# Patient Record
Sex: Female | Born: 1940 | Race: White | Hispanic: No | Smoking: Current every day smoker
Health system: Southern US, Community
[De-identification: ages and names within clinical notes are randomized; demographics above are authoritative.]

## PROBLEM LIST (undated history)

## (undated) DIAGNOSIS — I10 Essential (primary) hypertension: Secondary | ICD-10-CM

## (undated) DIAGNOSIS — E785 Hyperlipidemia, unspecified: Secondary | ICD-10-CM

## (undated) DIAGNOSIS — I872 Venous insufficiency (chronic) (peripheral): Secondary | ICD-10-CM

## (undated) DIAGNOSIS — G4733 Obstructive sleep apnea (adult) (pediatric): Secondary | ICD-10-CM

## (undated) DIAGNOSIS — J449 Chronic obstructive pulmonary disease, unspecified: Secondary | ICD-10-CM

## (undated) HISTORY — PX: JOINT REPLACEMENT: SHX530

## (undated) HISTORY — PX: CARPAL TUNNEL RELEASE: SHX101

---

## 1998-03-10 ENCOUNTER — Emergency Department (HOSPITAL_COMMUNITY): Admission: EM | Admit: 1998-03-10 | Discharge: 1998-03-10 | Payer: Self-pay | Admitting: Emergency Medicine

## 1998-03-10 ENCOUNTER — Encounter: Payer: Self-pay | Admitting: Emergency Medicine

## 1998-04-13 ENCOUNTER — Encounter: Admission: RE | Admit: 1998-04-13 | Discharge: 1998-06-04 | Payer: Self-pay

## 1998-09-25 ENCOUNTER — Encounter: Payer: Self-pay | Admitting: Family Medicine

## 1998-09-25 ENCOUNTER — Ambulatory Visit (HOSPITAL_COMMUNITY): Admission: RE | Admit: 1998-09-25 | Discharge: 1998-09-25 | Payer: Self-pay | Admitting: Family Medicine

## 1998-10-15 ENCOUNTER — Ambulatory Visit (HOSPITAL_COMMUNITY): Admission: RE | Admit: 1998-10-15 | Discharge: 1998-10-15 | Payer: Self-pay | Admitting: Family Medicine

## 1999-12-27 ENCOUNTER — Emergency Department (HOSPITAL_COMMUNITY): Admission: EM | Admit: 1999-12-27 | Discharge: 1999-12-28 | Payer: Self-pay | Admitting: Emergency Medicine

## 1999-12-27 ENCOUNTER — Encounter: Payer: Self-pay | Admitting: Emergency Medicine

## 1999-12-28 ENCOUNTER — Encounter: Payer: Self-pay | Admitting: Emergency Medicine

## 2000-04-04 ENCOUNTER — Encounter: Payer: Self-pay | Admitting: Family Medicine

## 2000-04-04 ENCOUNTER — Encounter: Admission: RE | Admit: 2000-04-04 | Discharge: 2000-04-04 | Payer: Self-pay | Admitting: Family Medicine

## 2000-05-15 ENCOUNTER — Encounter: Admission: RE | Admit: 2000-05-15 | Discharge: 2000-05-15 | Payer: Self-pay | Admitting: Neurosurgery

## 2000-05-15 ENCOUNTER — Encounter: Payer: Self-pay | Admitting: Neurosurgery

## 2000-05-29 ENCOUNTER — Encounter: Payer: Self-pay | Admitting: Neurosurgery

## 2000-05-29 ENCOUNTER — Encounter: Admission: RE | Admit: 2000-05-29 | Discharge: 2000-05-29 | Payer: Self-pay | Admitting: Neurosurgery

## 2000-06-12 ENCOUNTER — Encounter: Admission: RE | Admit: 2000-06-12 | Discharge: 2000-08-08 | Payer: Self-pay | Admitting: Neurosurgery

## 2000-08-09 ENCOUNTER — Encounter: Admission: RE | Admit: 2000-08-09 | Discharge: 2000-09-21 | Payer: Self-pay | Admitting: Neurosurgery

## 2000-08-29 ENCOUNTER — Ambulatory Visit (HOSPITAL_COMMUNITY): Admission: RE | Admit: 2000-08-29 | Discharge: 2000-08-29 | Payer: Self-pay | Admitting: Family Medicine

## 2000-08-29 ENCOUNTER — Encounter: Payer: Self-pay | Admitting: Family Medicine

## 2000-09-13 ENCOUNTER — Ambulatory Visit: Admission: RE | Admit: 2000-09-13 | Discharge: 2000-09-13 | Payer: Self-pay | Admitting: Family Medicine

## 2003-12-09 ENCOUNTER — Emergency Department (HOSPITAL_COMMUNITY): Admission: EM | Admit: 2003-12-09 | Discharge: 2003-12-09 | Payer: Self-pay | Admitting: Emergency Medicine

## 2004-06-09 ENCOUNTER — Ambulatory Visit (HOSPITAL_COMMUNITY): Admission: RE | Admit: 2004-06-09 | Discharge: 2004-06-10 | Payer: Self-pay | Admitting: Orthopedic Surgery

## 2004-12-20 ENCOUNTER — Ambulatory Visit (HOSPITAL_BASED_OUTPATIENT_CLINIC_OR_DEPARTMENT_OTHER): Admission: RE | Admit: 2004-12-20 | Discharge: 2004-12-20 | Payer: Self-pay | Admitting: Family Medicine

## 2004-12-26 ENCOUNTER — Ambulatory Visit: Payer: Self-pay | Admitting: Internal Medicine

## 2006-03-13 ENCOUNTER — Inpatient Hospital Stay (HOSPITAL_COMMUNITY): Admission: EM | Admit: 2006-03-13 | Discharge: 2006-03-15 | Payer: Self-pay | Admitting: Emergency Medicine

## 2006-03-14 ENCOUNTER — Encounter (INDEPENDENT_AMBULATORY_CARE_PROVIDER_SITE_OTHER): Payer: Self-pay | Admitting: Cardiology

## 2006-03-17 ENCOUNTER — Ambulatory Visit: Admission: RE | Admit: 2006-03-17 | Discharge: 2006-03-17 | Payer: Self-pay | Admitting: Family Medicine

## 2006-04-10 ENCOUNTER — Ambulatory Visit: Payer: Self-pay | Admitting: Internal Medicine

## 2006-06-09 ENCOUNTER — Ambulatory Visit: Payer: Self-pay | Admitting: Internal Medicine

## 2006-06-14 ENCOUNTER — Ambulatory Visit: Payer: Self-pay | Admitting: Internal Medicine

## 2007-05-03 ENCOUNTER — Encounter: Payer: Self-pay | Admitting: Internal Medicine

## 2008-01-24 ENCOUNTER — Emergency Department (HOSPITAL_COMMUNITY): Admission: EM | Admit: 2008-01-24 | Discharge: 2008-01-25 | Payer: Self-pay | Admitting: Emergency Medicine

## 2008-01-28 ENCOUNTER — Emergency Department (HOSPITAL_COMMUNITY): Admission: EM | Admit: 2008-01-28 | Discharge: 2008-01-28 | Payer: Self-pay | Admitting: *Deleted

## 2008-01-30 ENCOUNTER — Encounter: Admission: RE | Admit: 2008-01-30 | Discharge: 2008-01-30 | Payer: Self-pay | Admitting: Family Medicine

## 2009-01-07 ENCOUNTER — Ambulatory Visit: Payer: Self-pay | Admitting: Vascular Surgery

## 2009-01-07 ENCOUNTER — Encounter (INDEPENDENT_AMBULATORY_CARE_PROVIDER_SITE_OTHER): Payer: Self-pay | Admitting: Cardiovascular Disease

## 2009-01-07 ENCOUNTER — Inpatient Hospital Stay (HOSPITAL_COMMUNITY): Admission: EM | Admit: 2009-01-07 | Discharge: 2009-01-14 | Payer: Self-pay | Admitting: Emergency Medicine

## 2009-01-07 ENCOUNTER — Ambulatory Visit: Payer: Self-pay | Admitting: Cardiovascular Disease

## 2010-01-21 ENCOUNTER — Encounter (HOSPITAL_BASED_OUTPATIENT_CLINIC_OR_DEPARTMENT_OTHER)
Admission: RE | Admit: 2010-01-21 | Discharge: 2010-02-09 | Payer: Self-pay | Source: Home / Self Care | Attending: Internal Medicine | Admitting: Internal Medicine

## 2010-01-25 LAB — GLUCOSE, CAPILLARY: Glucose-Capillary: 252 mg/dL — ABNORMAL HIGH (ref 70–99)

## 2010-02-11 ENCOUNTER — Encounter (HOSPITAL_BASED_OUTPATIENT_CLINIC_OR_DEPARTMENT_OTHER): Payer: Medicare Other | Attending: Internal Medicine

## 2010-02-11 DIAGNOSIS — I10 Essential (primary) hypertension: Secondary | ICD-10-CM | POA: Insufficient documentation

## 2010-02-11 DIAGNOSIS — J449 Chronic obstructive pulmonary disease, unspecified: Secondary | ICD-10-CM | POA: Insufficient documentation

## 2010-02-11 DIAGNOSIS — J4489 Other specified chronic obstructive pulmonary disease: Secondary | ICD-10-CM | POA: Insufficient documentation

## 2010-02-11 DIAGNOSIS — R609 Edema, unspecified: Secondary | ICD-10-CM | POA: Insufficient documentation

## 2010-02-11 DIAGNOSIS — L97809 Non-pressure chronic ulcer of other part of unspecified lower leg with unspecified severity: Secondary | ICD-10-CM | POA: Insufficient documentation

## 2010-02-11 DIAGNOSIS — I872 Venous insufficiency (chronic) (peripheral): Secondary | ICD-10-CM | POA: Insufficient documentation

## 2010-02-11 DIAGNOSIS — E119 Type 2 diabetes mellitus without complications: Secondary | ICD-10-CM | POA: Insufficient documentation

## 2010-02-11 DIAGNOSIS — Z794 Long term (current) use of insulin: Secondary | ICD-10-CM | POA: Insufficient documentation

## 2010-02-12 ENCOUNTER — Encounter (INDEPENDENT_AMBULATORY_CARE_PROVIDER_SITE_OTHER): Payer: Medicare Other

## 2010-02-12 DIAGNOSIS — L97209 Non-pressure chronic ulcer of unspecified calf with unspecified severity: Secondary | ICD-10-CM

## 2010-03-11 ENCOUNTER — Encounter (HOSPITAL_BASED_OUTPATIENT_CLINIC_OR_DEPARTMENT_OTHER): Payer: Medicare Other | Attending: Internal Medicine

## 2010-03-11 DIAGNOSIS — R609 Edema, unspecified: Secondary | ICD-10-CM | POA: Insufficient documentation

## 2010-03-11 DIAGNOSIS — I10 Essential (primary) hypertension: Secondary | ICD-10-CM | POA: Insufficient documentation

## 2010-03-11 DIAGNOSIS — J449 Chronic obstructive pulmonary disease, unspecified: Secondary | ICD-10-CM | POA: Insufficient documentation

## 2010-03-11 DIAGNOSIS — L97809 Non-pressure chronic ulcer of other part of unspecified lower leg with unspecified severity: Secondary | ICD-10-CM | POA: Insufficient documentation

## 2010-03-11 DIAGNOSIS — E119 Type 2 diabetes mellitus without complications: Secondary | ICD-10-CM | POA: Insufficient documentation

## 2010-03-11 DIAGNOSIS — J4489 Other specified chronic obstructive pulmonary disease: Secondary | ICD-10-CM | POA: Insufficient documentation

## 2010-03-11 DIAGNOSIS — I872 Venous insufficiency (chronic) (peripheral): Secondary | ICD-10-CM | POA: Insufficient documentation

## 2010-03-11 DIAGNOSIS — Z794 Long term (current) use of insulin: Secondary | ICD-10-CM | POA: Insufficient documentation

## 2010-03-28 LAB — BASIC METABOLIC PANEL
BUN: 10 mg/dL (ref 6–23)
BUN: 9 mg/dL (ref 6–23)
CO2: 25 mEq/L (ref 19–32)
CO2: 28 mEq/L (ref 19–32)
CO2: 30 mEq/L (ref 19–32)
Calcium: 8.3 mg/dL — ABNORMAL LOW (ref 8.4–10.5)
Calcium: 8.5 mg/dL (ref 8.4–10.5)
Chloride: 102 mEq/L (ref 96–112)
Chloride: 102 mEq/L (ref 96–112)
Chloride: 104 mEq/L (ref 96–112)
Creatinine, Ser: 0.37 mg/dL — ABNORMAL LOW (ref 0.4–1.2)
Creatinine, Ser: 0.4 mg/dL (ref 0.4–1.2)
Creatinine, Ser: 0.49 mg/dL (ref 0.4–1.2)
GFR calc Af Amer: >60 mL/min (ref >60)
GFR calc non Af Amer: >60 mL/min (ref >60)
Glucose, Bld: 152 mg/dL — ABNORMAL HIGH (ref 70–99)
Glucose, Bld: 180 mg/dL — ABNORMAL HIGH (ref 70–99)
Glucose, Bld: 198 mg/dL — ABNORMAL HIGH (ref 70–99)
Potassium: 3.3 mEq/L — ABNORMAL LOW (ref 3.5–5.1)
Potassium: 3.6 mEq/L (ref 3.5–5.1)
Sodium: 135 mEq/L (ref 135–145)

## 2010-03-28 LAB — GLUCOSE, CAPILLARY
Glucose-Capillary: 110 mg/dL — ABNORMAL HIGH (ref 70–99)
Glucose-Capillary: 146 mg/dL — ABNORMAL HIGH (ref 70–99)
Glucose-Capillary: 177 mg/dL — ABNORMAL HIGH (ref 70–99)
Glucose-Capillary: 178 mg/dL — ABNORMAL HIGH (ref 70–99)
Glucose-Capillary: 191 mg/dL — ABNORMAL HIGH (ref 70–99)
Glucose-Capillary: 216 mg/dL — ABNORMAL HIGH (ref 70–99)
Glucose-Capillary: 219 mg/dL — ABNORMAL HIGH (ref 70–99)
Glucose-Capillary: 221 mg/dL — ABNORMAL HIGH (ref 70–99)
Glucose-Capillary: 221 mg/dL — ABNORMAL HIGH (ref 70–99)
Glucose-Capillary: 267 mg/dL — ABNORMAL HIGH (ref 70–99)
Glucose-Capillary: 313 mg/dL — ABNORMAL HIGH (ref 70–99)
Glucose-Capillary: 368 mg/dL — ABNORMAL HIGH (ref 70–99)

## 2010-03-28 LAB — PHOSPHORUS: Phosphorus: 4.1 mg/dL (ref 2.3–4.6)

## 2010-04-12 LAB — BLOOD GAS, ARTERIAL
Bicarbonate: 24.1 mEq/L — ABNORMAL HIGH (ref 20.0–24.0)
Drawn by: 232811
O2 Content: 6 L/min
O2 Saturation: 90.3 %
Patient temperature: 98.6
pH, Arterial: 7.404 — ABNORMAL HIGH (ref 7.350–7.400)

## 2010-04-12 LAB — GLUCOSE, CAPILLARY
Glucose-Capillary: 192 mg/dL — ABNORMAL HIGH (ref 70–99)
Glucose-Capillary: 230 mg/dL — ABNORMAL HIGH (ref 70–99)
Glucose-Capillary: 272 mg/dL — ABNORMAL HIGH (ref 70–99)
Glucose-Capillary: 282 mg/dL — ABNORMAL HIGH (ref 70–99)
Glucose-Capillary: 287 mg/dL — ABNORMAL HIGH (ref 70–99)
Glucose-Capillary: 329 mg/dL — ABNORMAL HIGH (ref 70–99)
Glucose-Capillary: 339 mg/dL — ABNORMAL HIGH (ref 70–99)
Glucose-Capillary: 349 mg/dL — ABNORMAL HIGH (ref 70–99)
Glucose-Capillary: 357 mg/dL — ABNORMAL HIGH (ref 70–99)

## 2010-04-12 LAB — CK TOTAL AND CKMB (NOT AT ARMC)
CK, MB: 2.5 ng/mL (ref 0.3–4.0)
CK, MB: 2.6 ng/mL (ref 0.3–4.0)
CK, MB: 2.7 ng/mL (ref 0.3–4.0)
Relative Index: 1.1 (ref 0.0–2.5)
Relative Index: 1.2 (ref 0.0–2.5)
Total CK: 225 U/L — ABNORMAL HIGH (ref 7–177)
Total CK: 227 U/L — ABNORMAL HIGH (ref 7–177)
Total CK: 229 U/L — ABNORMAL HIGH (ref 7–177)

## 2010-04-12 LAB — BASIC METABOLIC PANEL
BUN: 17 mg/dL (ref 6–23)
BUN: 7 mg/dL (ref 6–23)
BUN: 8 mg/dL (ref 6–23)
CO2: 25 mEq/L (ref 19–32)
Calcium: 8 mg/dL — ABNORMAL LOW (ref 8.4–10.5)
Calcium: 8.5 mg/dL (ref 8.4–10.5)
Chloride: 101 mEq/L (ref 96–112)
Chloride: 103 mEq/L (ref 96–112)
Chloride: 105 mEq/L (ref 96–112)
Creatinine, Ser: 0.46 mg/dL (ref 0.4–1.2)
Creatinine, Ser: 0.51 mg/dL (ref 0.4–1.2)
Creatinine, Ser: 0.52 mg/dL (ref 0.4–1.2)
GFR calc Af Amer: >60 mL/min (ref >60)
GFR calc non Af Amer: >60 mL/min (ref >60)
GFR calc non Af Amer: >60 mL/min (ref >60)
Glucose, Bld: 310 mg/dL — ABNORMAL HIGH (ref 70–99)
Glucose, Bld: 317 mg/dL — ABNORMAL HIGH (ref 70–99)
Potassium: 3.4 mEq/L — ABNORMAL LOW (ref 3.5–5.1)
Potassium: 3.7 mEq/L (ref 3.5–5.1)
Sodium: 136 mEq/L (ref 135–145)

## 2010-04-12 LAB — DIFFERENTIAL
Basophils Absolute: 0 10*3/uL (ref 0.0–0.1)
Basophils Absolute: 0 10*3/uL (ref 0.0–0.1)
Eosinophils Absolute: 0 10*3/uL (ref 0.0–0.7)
Eosinophils Relative: 0 % (ref 0–5)
Lymphocytes Relative: 13 % (ref 12–46)
Lymphocytes Relative: 7 % — ABNORMAL LOW (ref 12–46)
Lymphs Abs: 0.6 10*3/uL — ABNORMAL LOW (ref 0.7–4.0)
Neutro Abs: 7.8 10*3/uL — ABNORMAL HIGH (ref 1.7–7.7)
Neutrophils Relative %: 82 % — ABNORMAL HIGH (ref 43–77)
Neutrophils Relative %: 91 % — ABNORMAL HIGH (ref 43–77)

## 2010-04-12 LAB — URINALYSIS, ROUTINE W REFLEX MICROSCOPIC
Nitrite: POSITIVE — AB
Protein, ur: NEGATIVE mg/dL
Specific Gravity, Urine: 1.035 — ABNORMAL HIGH (ref 1.005–1.030)
Urobilinogen, UA: 1 mg/dL (ref 0.0–1.0)

## 2010-04-12 LAB — CULTURE, BLOOD (ROUTINE X 2)
Culture: NO GROWTH
Culture: NO GROWTH

## 2010-04-12 LAB — CBC
HCT: 37.9 % (ref 36.0–46.0)
MCHC: 32.3 g/dL (ref 30.0–36.0)
MCV: 85.8 fL (ref 78.0–100.0)
MCV: 86 fL (ref 78.0–100.0)
Platelets: 185 10*3/uL (ref 150–400)
Platelets: 187 10*3/uL (ref 150–400)
RDW: 16.5 % — ABNORMAL HIGH (ref 11.5–15.5)
RDW: 17.3 % — ABNORMAL HIGH (ref 11.5–15.5)
WBC: 8.6 10*3/uL (ref 4.0–10.5)

## 2010-04-12 LAB — LIPID PANEL
Cholesterol: 125 mg/dL (ref 0–200)
LDL Cholesterol: 55 mg/dL (ref 0–99)
Triglycerides: 206 mg/dL — ABNORMAL HIGH (ref <150)

## 2010-04-12 LAB — URINE CULTURE

## 2010-04-12 LAB — URINE MICROSCOPIC-ADD ON

## 2010-04-12 LAB — TROPONIN I
Troponin I: 0.02 ng/mL (ref 0.00–0.06)
Troponin I: 0.03 ng/mL (ref 0.00–0.06)

## 2010-04-12 LAB — HEMOGLOBIN A1C: Hgb A1c MFr Bld: 11.2 % — ABNORMAL HIGH (ref 4.6–6.1)

## 2010-04-15 ENCOUNTER — Encounter (HOSPITAL_BASED_OUTPATIENT_CLINIC_OR_DEPARTMENT_OTHER): Payer: Medicare Other | Attending: Internal Medicine

## 2010-04-15 DIAGNOSIS — E119 Type 2 diabetes mellitus without complications: Secondary | ICD-10-CM | POA: Insufficient documentation

## 2010-04-15 DIAGNOSIS — J4489 Other specified chronic obstructive pulmonary disease: Secondary | ICD-10-CM | POA: Insufficient documentation

## 2010-04-15 DIAGNOSIS — R609 Edema, unspecified: Secondary | ICD-10-CM | POA: Insufficient documentation

## 2010-04-15 DIAGNOSIS — Z794 Long term (current) use of insulin: Secondary | ICD-10-CM | POA: Insufficient documentation

## 2010-04-15 DIAGNOSIS — I872 Venous insufficiency (chronic) (peripheral): Secondary | ICD-10-CM | POA: Insufficient documentation

## 2010-04-15 DIAGNOSIS — L97809 Non-pressure chronic ulcer of other part of unspecified lower leg with unspecified severity: Secondary | ICD-10-CM | POA: Insufficient documentation

## 2010-04-15 DIAGNOSIS — I10 Essential (primary) hypertension: Secondary | ICD-10-CM | POA: Insufficient documentation

## 2010-04-15 DIAGNOSIS — J449 Chronic obstructive pulmonary disease, unspecified: Secondary | ICD-10-CM | POA: Insufficient documentation

## 2010-04-26 LAB — BRAIN NATRIURETIC PEPTIDE: Pro B Natriuretic peptide (BNP): 38 pg/mL (ref 0.0–100.0)

## 2010-04-26 LAB — URINALYSIS, ROUTINE W REFLEX MICROSCOPIC
Glucose, UA: NEGATIVE mg/dL
Ketones, ur: 40 mg/dL — AB
Protein, ur: NEGATIVE mg/dL

## 2010-04-26 LAB — BASIC METABOLIC PANEL
BUN: 7 mg/dL (ref 6–23)
BUN: 4 mg/dL — ABNORMAL LOW (ref 6–23)
CO2: 26 mEq/L (ref 19–32)
CO2: 23 mEq/L (ref 19–32)
Calcium: 9.3 mg/dL (ref 8.4–10.5)
Calcium: 9.2 mg/dL (ref 8.4–10.5)
Chloride: 101 mEq/L (ref 96–112)
Chloride: 101 mEq/L (ref 96–112)
Creatinine, Ser: 0.43 mg/dL (ref 0.4–1.2)
Creatinine, Ser: 0.37 mg/dL — ABNORMAL LOW (ref 0.4–1.2)
GFR calc Af Amer: >60 mL/min (ref >60)
GFR calc Af Amer: >60 mL/min (ref >60)
GFR calc non Af Amer: >60 mL/min (ref >60)
GFR calc non Af Amer: >60 mL/min (ref >60)
Glucose, Bld: 182 mg/dL — ABNORMAL HIGH (ref 70–99)
Glucose, Bld: 159 mg/dL — ABNORMAL HIGH (ref 70–99)
Potassium: 3.2 mEq/L — ABNORMAL LOW (ref 3.5–5.1)
Potassium: 3.1 mEq/L — ABNORMAL LOW (ref 3.5–5.1)
Sodium: 139 mEq/L (ref 135–145)
Sodium: 137 mEq/L (ref 135–145)

## 2010-04-26 LAB — URINE MICROSCOPIC-ADD ON

## 2010-04-26 LAB — CBC
HCT: 44.7 % (ref 36.0–46.0)
HCT: 45.9 % (ref 36.0–46.0)
Hemoglobin: 14.4 g/dL (ref 12.0–15.0)
MCHC: 32.7 g/dL (ref 30.0–36.0)
MCHC: 31.4 g/dL (ref 30.0–36.0)
MCV: 80 fL (ref 78.0–100.0)
MCV: 82 fL (ref 78.0–100.0)
Platelets: 220 10*3/uL (ref 150–400)
Platelets: 211 10*3/uL (ref 150–400)
RBC: 5.6 MIL/uL — ABNORMAL HIGH (ref 3.87–5.11)
RDW: 19.4 % — ABNORMAL HIGH (ref 11.5–15.5)
RDW: 18.9 % — ABNORMAL HIGH (ref 11.5–15.5)
WBC: 10.8 10*3/uL — ABNORMAL HIGH (ref 4.0–10.5)

## 2010-04-26 LAB — POCT CARDIAC MARKERS
CKMB, poc: 1.9 ng/mL (ref 1.0–8.0)
CKMB, poc: 2.3 ng/mL (ref 1.0–8.0)
Myoglobin, poc: 36.1 ng/mL (ref 12–200)
Myoglobin, poc: 57.4 ng/mL (ref 12–200)
Troponin i, poc: <0.05 ng/mL (ref 0.00–0.09)
Troponin i, poc: <0.05 ng/mL (ref 0.00–0.09)

## 2010-04-26 LAB — DIFFERENTIAL
Basophils Absolute: 0.1 10*3/uL (ref 0.0–0.1)
Basophils Absolute: 0 10*3/uL (ref 0.0–0.1)
Basophils Relative: 1 % (ref 0–1)
Basophils Relative: 0 % (ref 0–1)
Eosinophils Absolute: 0.2 10*3/uL (ref 0.0–0.7)
Eosinophils Absolute: 0.3 10*3/uL (ref 0.0–0.7)
Eosinophils Relative: 2 % (ref 0–5)
Eosinophils Relative: 3 % (ref 0–5)
Lymphocytes Relative: 19 % (ref 12–46)
Lymphs Abs: 2.1 10*3/uL (ref 0.7–4.0)
Monocytes Absolute: 0.6 10*3/uL (ref 0.1–1.0)
Monocytes Relative: 5 % (ref 3–12)
Neutro Abs: 7.8 10*3/uL — ABNORMAL HIGH (ref 1.7–7.7)
Neutrophils Relative %: 72 % (ref 43–77)

## 2010-04-26 LAB — POCT I-STAT 3, ART BLOOD GAS (G3+)
Acid-Base Excess: 2 mmol/L (ref 0.0–2.0)
Bicarbonate: 25.5 mEq/L — ABNORMAL HIGH (ref 20.0–24.0)
O2 Saturation: 91 %
Patient temperature: 97.5
TCO2: 27 mmol/L (ref 0–100)
pCO2 arterial: 35.9 mmHg (ref 35.0–45.0)
pH, Arterial: 7.457 — ABNORMAL HIGH (ref 7.350–7.400)
pO2, Arterial: 55 mmHg — ABNORMAL LOW (ref 80.0–100.0)

## 2010-05-25 NOTE — Assessment & Plan Note (Signed)
[facility]                             PULMONARY OFFICE NOTE   NAME:Denise Lee, Denise Lee                   MRN:          [id number]  DATE:06/14/2006                            DOB:          11/05/1940    HISTORY OF PRESENT ILLNESS:  A 70 year old, white female who is  wheelchair bound, oxygen-dependent and still smoking cigarettes with  diagnosis of COPD, but minimum air flow obstruction.  She does, however,  have significant reduction in diffusion capacity that is probably  related to smoking.  CT scan dated March 13, 2006, does not show any  evidence of pulmonary emboli or significant interstitial lung disease.   She states that she is breathing okay, but basically staying in a  wheelchair most of the time.  She is having no orthopnea, nocturnal  wheeze, fevers, chills, sweats or need for rescue therapy.   Since her previous visit, lisinopril was stopped and she is having  marked improvement in throat clearing, but still has a smoker's  rattle.   MEDICATIONS:  Advair 100/50 b.i.d.  See face sheet dated June 14, 2006.   PHYSICAL EXAMINATION:  GENERAL:  She is a depressed-appearing,  ambulatory, white, obese female in no acute distress.  VITAL SIGNS:  Stable.  HEENT:  Unremarkable.  Oropharynx clear.  LUNGS:  Diminished breath sounds, but no wheezing.  HEART:  Regular rate and rhythm without murmurs, rubs or gallops.  ABDOMEN:  Soft, benign.  EXTREMITIES:  Warm without calf tenderness, clubbing, cyanosis or edema   IMPRESSION/PLAN:  Chronic obstructive pulmonary disease with asthmatic  bronchitic component and acquired mucosal dysfunction secondary to  ongoing smoking.  In this setting, I believe Advair is doing a  reasonable job even at a low dose in reversing airway inflammation and  my only recommendation, if needed, would be to increase the Advair up to  250/50 b.i.d.  Based on how well-preserved her FEV-1 is, she would not  benefit at this  point from Spiriva.   In fact, I do not believe she would really benefit from continuing to  see me based on her lung function studies.  There is nothing we have to  offer here other than the advice to stop smoking.  She asked me, isn't  there something you can give me for this.  I told her that I would  defer this to Dr. Tiburcio Pea, but put the ball back in her court by asking  her to set a quit date and then see Dr. Tiburcio Pea at least a week before  the quit date for strategy to maintain off of cigarettes and would defer  this issue to Dr. Tiburcio Pea' capable hands unless otherwise requested by  Dr. Tiburcio Pea.   If she continues to smoke, I believe she will have a downhill course  with recurrent bronchitis and further loss of diffusion capacity.  There  is no medication that will reverse this and I explained this to her  bluntly.  If she stopped smoking, I believe her lung function is well  enough preserved that she will not need to see me either because she  will have no significant pulmonary symptoms to treat and very well may  be able to come off the oxygen completely, too.  I told her the only way  to know this is to prove me wrong by stopping smoking and then re-  evaluating 6 weeks later.     Denise Lee. Sherene Sires, MD, [facility]  Electronically Signed    MBW/MedQ  DD: 06/15/2006  DT: 06/15/2006  Job #: [id number]   cc:   Holley Bouche, M.D.

## 2010-05-28 NOTE — Discharge Summary (Signed)
Denise Lee, Denise Lee            ACCOUNT NO.:  [id number]   MEDICAL RECORD NO.:  [id number]          PATIENT TYPE:  INP   LOCATION:  1432                         FACILITY:  [facility]   PHYSICIAN:  Andres Shad. Rudean Curt, MD     DATE OF BIRTH:  03/03/1940   DATE OF ADMISSION:  03/13/2006  DATE OF DISCHARGE:  03/15/2006                               DISCHARGE SUMMARY   DISCHARGE DIAGNOSES:  1. Hyponatremia.  2. Hypoxemia.   DISCHARGE MEDICATIONS:  1. Avelox 400 mg once daily for 7 days.  2. Prednisone tapering over 7 days.  3. Albuterol 2 puffs with spacer every 4-6 hours as needed.  4. Glipizide 10 mg twice daily.  5. Avandia 8 mg daily.  6. Metformin 1000 mg twice daily.  7. Paxil 40 mg daily.  8. Tramadol 50 mg every 12 hours as needed.  9. Calcium with vitamin D 1200 mg twice daily.  10.Vytorin 10/40 mg daily.  11.Lisinopril with hydrochlorothiazide 10/12.5 mg daily.  12.Aspirin 325 mg daily.  13.Advair twice daily.   CONDITION ON DISCHARGE:  Improved.   SUMMARY OF HOSPITALIZATION:  Denise Lee is a 70 year old female with a  past medical history significant for type 2 diabetes,  hypercholesterolemia, hypertension, depression, and asthma.  She was  admitted on March 3 with a two-day history of difficulty breathing, leg  swelling, and cough.  She was initially found to have oxygen saturations  of 84% on room air, improving to 93% on four liters by nasal cannula.  Initial clinical assessment revealed leg swelling with pitting edema to  the upper shins as well as prolonged expiration and wheezing in all lung  fields.  Initial laboratory studies were remarkable for a sodium of 119,  creatinine of 0.3, white blood cell count of 15.2 and a BNP of 75.   A chest x-ray was performed, which was consistent with pulmonary edema.   CT scan of the chest was performed in the emergency room.  This was  negative for pulmonary embolism.  It showed bibasilar atelectasis,  diffuse pulmonary edema,  a right pleural effusion, and patchy air space  disease in the right middle lobe, concerning for pneumonia.  The patient  was admitted to the hospital with diagnoses of hyponatremia as well as  pulmonary edema versus viral pneumonia.  Because of her elevated white  blood cell count, she was begun on moxifloxacin.  She was fluid-  restricted to 1000 ml of free water daily and given IV diuretics.  She  was also treated with bronchodilators and steroids for what was possibly  an asthma exacerbation.  The patient diuresed several liters during her  hospitalization.  Her pulmonary exam clinically improved.  BNP levels  remained under 100.  By the time of discharge, her serum sodium had  improved to 129.   At discharge, the patient's oxygen saturation was 100% on 2 liters via  nasal cannula.  Her lung exam was considerably improved; however, she  still did have wheezing in all lung fields.  Her air entry was  considerably better, however.  An echocardiogram was also performed  during this  hospitalization.  This revealed normal left ventricular  function.  Ejection fraction was 55-65%.  Left ventricular wall  thickness was mildly increased.  Left ventricular filling pressures were  high.  Several valve abnormalities were described, including mildly  increased aortic valve thickness, moderate-to-marked annular  calcification, and mild-to-moderate tricuspid regurgitation.  She was  also found to have mildly to moderately dilated right ventricle with  reduced systolic function as well as a dilated right atrium.   ASSESSMENT/PLAN:  1. Hyponatremia:  This is resolved.  The etiology is unclear.  She had      hypervolemic hyponatremia.  This can certainly be seen with      congestive heart failure; however, given the lack of clarity as to      whether this was truly present, suspect the patient does not have      hypothyroidism or adrenal insufficiency.  She also did note have      proteinuria;  thus, I am not entirely clear as to the etiology of      her volume overload; however, it appears that her right heart is      more diseased than her left heart and may well be that she has some      degree of right heart failure with peripheral edema.  The patient      was evaluated for hypothyroidism and adrenal insufficiency.  These      tests were normal.  Her hyponatremia resolved with free water      restriction and furosemide.  2. Pulmonary edema:  The patient may have some diastolic dysfunction      that was not seen on her echocardiogram.  It is not clear why she      would have a normal BNP; however, if she has left ventricular      failure.  Nonetheless, she clinically improved with diuresis and      may need further cardiology evaluation in the future.  3. Hypoxemia:  This appears to be a combination of bronchospasm with      either a primary infectious or cardiogenic process.  Her hypoxemia      has improved, and the patient will continue on antibiotics,      bronchodilators, and steroids.      Andres Shad. Rudean Curt, MD  Electronically Signed     PML/MEDQ  D:  03/15/2006  T:  03/15/2006  Job:  [id number]   cc:   Holley Bouche, M.D.  Fax: [phone]

## 2010-05-28 NOTE — Procedures (Signed)
NAME:  Denise Lee, Denise Lee            ACCOUNT NO.:  [id number]   MEDICAL RECORD NO.:  [id number]          PATIENT TYPE:  OUT   LOCATION:  SLEEP CENTER                 FACILITY:  [facility]   PHYSICIAN:  Clinton D. Maple Hudson, M.D. DATE OF BIRTH:  December 11, 1940   DATE OF STUDY:  12/20/2004                              NOCTURNAL POLYSOMNOGRAM   REFERRING PHYSICIAN:  Dr. Noberto Retort.   DATE OF STUDY:  December 20, 2004.   INDICATION FOR STUDY:  Insomnia with sleep apnea.   EPWORTH SLEEPINESS SCORE:  18/24.   BMI:  41.7.   WEIGHT:  230 pounds.   SLEEP ARCHITECTURE:  Total sleep time 374 minutes with sleep efficiency 74%.  Stage I was 22%, stage II 48%, stages III and IV 8%, REM 22% of total sleep  time. Sleep latency 1.5 minutes, REM latency 337 minutes, awake after sleep  onset 133 minutes, arousal index 48.   RESPIRATORY DATA:  Split study protocol:  Apnea/hypopnea index (AHI, RDI)  79.7 obstructive events per hour indicating severe obstructive sleep  apnea/hypopnea syndrome. There were 184 obstructive apneas and 49 hypopneas  before C-PAP. Most sleep and most events were while on left side. REM AHI  absent. C-PAP was titrated for events and snoring to a final pressure of 15  CWP, AHI 0 per hour. Obstructive events were largely prevented at most  pressure used but she continued to snore until 15 CWP. A Respironics  ComfortGel nasal mask, size small, was used with heated humidifier.   OXYGEN DATA:  Moderate snoring with oxygen desaturation to a nadir of 82%  before C-PAP. After C-PAP control, saturation held 90-95% on room air.   CARDIAC DATA:  Normal sinus rhythm. Occasional PVCs.   MOVEMENT/PARASOMNIA:  Occasional leg jerk with little effect on sleep.   IMPRESSION/RECOMMENDATIONS:  1.  Severe obstructive sleep apnea/hypopnea syndrome, AHI 79.7 per hour with      moderate snoring and oxygen desaturation to 82%. Most events were while      sleeping on her side.  2.  Successful C-PAP  titration to 15 CWP, AHI 0 per hour. A small      Respironics ComfortGel nasal mask was used with heated humidifier.      Clinton D. Maple Hudson, M.D.  Diplomate, Biomedical engineer of Sleep Medicine  Electronically Signed     CDY/MEDQ  D:  12/26/2004 15:57:28  T:  12/27/2004 00:24:13  Job:  [id number]

## 2010-05-28 NOTE — Assessment & Plan Note (Signed)
[facility]                             PULMONARY OFFICE NOTE   NAME:BRIDGESShani, Lee                   MRN:          [id number]  DATE:04/10/2006                            DOB:          05/06/1940    PULMONARY NEW PATIENT EVALUATION:   CHIEF COMPLAINT:  Dyspnea.   HISTORY:  This is a 70 year old white female active smoker with new-  onset hypoxemic respiratory failure, hospitalized at [facility] from March 3-5  with new-onset pulmonary edema.  However, note that the BNP levels  were never above 100 and there was no evidence of left ventricular  systolic dysfunction.  At that point she was still on Avandia, which has  been discontinued, and she feels she is doing somewhat better since it  was stopped.  However, she is still short of breath just going from room  to room at home.  She admits that her knees and back slow her down just  about as much as her breathing does.  She denies any orthopnea,  exertional or pleuritic chest pain, fevers, chills, sweats, but  continues to have leg swelling that has improved since she stopped  Avandia.   PAST MEDICAL HISTORY:  Significant for hypertension, for which she is on  ACE inhibitors.  Also carries a diagnosis of diabetes, asthma,  hyperlipidemia and sleep apnea, for which she uses a CPAP device.  She  has had previous knee and herniated disk surgery.   ALLERGIES:  CODEINE.   MEDICATIONS:  Taken in detail on the worksheet.  See the column dated  April 10, 2006, for details, and does include both Advair and ACE  inhibitors.   SOCIAL HISTORY:  She continues to smoke 6 cigarettes a day.  She is  retired but no unusual travel, pet or hobby exposure.   FAMILY HISTORY:  Taken in detail and significant for the absence of  respiratory diseases or atopy.   REVIEW OF SYSTEMS:  Also taken in detail and negative except as outlined  above.   PHYSICAL EXAMINATION:  GENERAL:  This is a depressed and discouraged-  appearing wheelchair-bound white female, with a typical smoker's hack  type of congested-sounding cough, in no acute distress at rest.  VITAL SIGNS:  She is afebrile with stable vital signs, blood pressure  124/80.  HEENT:  Significant for oropharynx clear.  Dentition is intact.  NECK:  Supple without cervical adenopathy or tenderness.  Trachea is  midline with no thyromegaly.  LUNGS:  Lung fields reveal pseudowheeze only.  CARDIAC:  There is a regular rate and rhythm without murmur, gallop, or  rub.  ABDOMEN:  Soft, benign.  EXTREMITIES:  1+ pitting from the knee down bilaterally.  NEUROLOGIC:  Nonfocal, absent pathologic reflexes.  SKIN:  Warm and dry.   Hemoglobin saturation 98% on 2 L nasal prongs.   Chest x-ray is pending.   CT scan of the chest was reviewed from March 13, 2006, and indicates  bibasilar atelectasis, pulmonary edema and right pleural effusion.   IMPRESSION:  1. Acute hypoxemic respiratory failure, now chronically O2-dependent      in the setting  of continued smoking against medical advice with a      typical smoker's rattle.  Clearly she has chronic obstructive      pulmonary disease/chronic bronchitis that is interfering with gas      exchange but I do not believe it is enough to explain her recent      decompensation.  I am strongly suspicious Avandia may have      contributed to fluid retention that was not picked up by      echocardiogram or BNP, both of which were really unremarkable.  I      have agreed with stopping the Avandia indefinitely and will leave      diuretic management to Dr. Harvie Heck Harris's capable hands.  2. However, she continues to be short of breath disproportionate to      objective findings and has a significant pseudowheeze in the upper      airway that may be ACE inhibitor related.  To sort through the      differential I am going to recommend stopping the ACE inhibitor and      replacing it with Diovan 160/12.5 mg for the next 6  weeks before      returning here for a set of PFTs.  In the meantime, I told her      point blank that it makes no sense to have oxygen and smoke and      that she will need to make her mind up sooner rather than later      about end-of-life issues if she continues to smoke in this setting.      I did not, however, get a commitment from here in this regard.  3. Right pleural effusion and pulmonary edema probably related to      Avandia as noted above.  We will check a chest x-ray pending and      see if anything further needs to be done when she returns for      follow-up pulmonary function tests.     [name]. Sherene Sires, MD, [facility]  Electronically Signed    MBW/MedQ  DD: 04/10/2006  DT: 04/11/2006  Job #: [id number]   cc:   Denise Lee, M.D.

## 2010-05-28 NOTE — H&P (Signed)
NAMEMAKINSEY, PEPITONE            ACCOUNT NO.:  [id number]   MEDICAL RECORD NO.:  [id number]          PATIENT TYPE:  EMS   LOCATION:  ED                           FACILITY:  [facility]   PHYSICIAN:  Andres Shad. Rudean Curt, MD     DATE OF BIRTH:  02/03/1940   DATE OF ADMISSION:  03/13/2006  DATE OF DISCHARGE:                              HISTORY & PHYSICAL   CHIEF COMPLAINT:  Shortness of breath.   HISTORY OF PRESENT ILLNESS:  Denise Lee is a 70 year old female with a  number of medical problems who presented to medical attention today  complaining of shortness of breath.  She has experienced difficulty  breathing, leg swelling, and cough for approximately 2 days.  She  describes the leg swelling as new and shortness of breath is especially  with exertion but also at rest.  The cough has been productive of scant  whitish fluid.   REVIEW OF SYSTEMS:  No fever or chills.  No upper respiratory symptoms.  Yesterday there was some nausea and vomiting.  Her daughter thinks her  speech has been somewhat slurred.   PAST MEDICAL HISTORY:  1. Hypertension.  2. Seasonal allergies.  3. Asthma.  4. Type 2 diabetes mellitus.  5. Hypercholesterolemia.  6. Depression.  7. Pneumonia.  She has no history of kidney disease or heart disease.   SOCIAL HISTORY:  The patient lives alone.  She is retired.  She smokes a  half pack per day which she has done for many years.  She denies alcohol  or illicit drug use.   MEDICATIONS:  1. Avandia 8 mg daily.  2. Glipizide 10 mg twice daily.  3. Metformin 1,000 mg twice daily.  4. Paroxetine 40 mg daily.  5. Tramadol 50 mg as needed twice daily.  6. Calcium with vitamin D 1200 mg twice daily.  7. Vytorin 10/40 mg daily.  8. Lisinopril with HCTZ 10/12.5 mg daily.  9. Aspirin 325 mg daily.  10.Albuterol as needed.   ALLERGIES:  CODEINE.   PHYSICAL EXAMINATION:  VITAL SIGNS:  Temperature 97.7, heart rate 81,  blood pressure 115/51, oxygen saturation 84% on  room air, 93% on 4 liter  by nasal cannula.  GENERAL APPEARANCE:  The patient was alert and in no apparent distress.  She did have an increased work-of-breathing and was slightly breathless  while talking.  She was significantly obese.  HEENT:  Mucous membranes were moist.  JVD could not be assessed because  of the patient's obesity.  LUNGS:  Breath sounds were diminished in all lung fields.  There was  prolonged expiration and end expiratory wheezing.  CARDIOVASCULAR:  Regular normal S1 and S2.  No murmurs, rubs, or gallops  were appreciated.  ABDOMEN:  Normal bowel sounds.  Soft, nontender, nondistended.  No  organomegaly.  EXTREMITIES:  There is pitting edema up to the upper shins and nearly to  the knees.   A chest x-ray was performed.  It was most consistent with pulmonary  edema.   LABORATORY STUDIES:  BNP was 75.  Sodium 119, potassium 3, chloride 83,  bicarbonate 24, glucose 259,  BUN 10, creatinine 0.3, calcium 8.  CBC:  White blood cell count 15.2, hemoglobin 10, hematocrit 32.9, platelets  341, neutrophils 87%, lymphocytes 8%, monocytes 3%.  D-dimer elevated at  1.19.   ASSESSMENT/PLAN:  1. Shortness of breath.  The patient on chest x-ray appears to have      pulmonary edema.  However, this is inconsistent with her BNP which      is quite low at 75.  She did not give a history of congestive heart      failure but she had some risk factors with hypertension, diabetes,      and hypercholesterolemia.  She is also on Avandia which may cause      fluid retention and pulmonary edema.  She has a history of mild      asthma which has not been active in quite a long time.  However,      she is having some wheezing.  I think it is most likely that the      patient is having a viral syndrome or atypical pneumonia with      superimposed pulmonary edema.  Plan:  I will give her steroids and      bronchodilators.  I will check a mycoplasma serology and a nasal      flu.  I will begin  empiric treatment with levofloxacin.  I think it      is unlikely that the patient has a classic community-acquired      pneumonia, but with her oxygen requirement and increased white      count, I think it would be appropriate to treat her empirically.      Additionally, I will treat her with intravenous Lasix and closely      monitor her urine output.  2. Hyponatremia.  The patient has hypervolemic hyponatremia.  This      will be consistent with cardiogenic pulmonary edema.  I will give      diuretics and also free water restrict the patient to 1,000 ml per      day.  I will check TSH and cortisol in the morning as well as urine      electrolytes.  Additionally, I have ordered a 2D echocardiogram.      Her electrolytes will be monitored approximately 12 hours from now.  3. Diabetes.  I expect that the patient's glycemic control will worsen      as I am holding her Avandia and starting her on steroids.  Thus, I      will continue her glipizide and metformin as well as instituting      sliding scale insulin coverage.  4. Hypercholesterolemia.  I will continue Vytorin.  5. Hypertension.  I will continue Lisinopril.  However, I will hold      the hydrochlorothiazide component because the patient is so      hyponatremic.  6. Hypokalemia.  I will replace this with 40 mEq potassium now and      then recheck her potassium in the morning.      Andres Shad. Rudean Curt, MD  Electronically Signed     PML/MEDQ  D:  03/13/2006  T:  03/13/2006  Job:  [id number]   cc:   Holley Bouche, M.D.  Fax: [phone]

## 2010-05-28 NOTE — Op Note (Signed)
NAMECHRISTOL, Denise Lee            ACCOUNT NO.:  [id number]   MEDICAL RECORD NO.:  [id number]          PATIENT TYPE:  OIB   LOCATION:  2899                         FACILITY:  [facility]   PHYSICIAN:  Harvie Junior, M.D.   DATE OF BIRTH:  [birth year]   DATE OF PROCEDURE:  06/09/2004  DATE OF DISCHARGE:                                 OPERATIVE REPORT   PREOPERATIVE DIAGNOSIS:  Medial meniscal tear with known anterior cruciate  ligament tear.   POSTOPERATIVE DIAGNOSES:  1.  Chondromalacia of medial femoral condyle.  2.  Chondromalacia patellofemoral joint.  3.  Anterior cruciate ligament tear.   OPERATION PERFORMED:  1.  Chondroplasty medial femoral condyle.  2.  Chondromalacia patellofemoral joint.  3.  Removal of osteocartilaginous loose bodies.   SURGEON:  Harvie Junior, M.D.   ASSISTANT:  Marshia Ly, P.A.   ANESTHESIA:  General.   INDICATIONS FOR PROCEDURE:  The patient is a 70 year old female with a long  history of having significant medial joint line tenderness.  She was  evaluated in the office and found to have MRI which showed that she had a  medial meniscal tear as well as anterior cruciate ligament tear.  After  failure of conservative care she was ultimately taken to the operating room  for arthroscopic evaluation.   DESCRIPTION OF PROCEDURE:  The patient was taken to the operating room.  After adequate anesthesia was obtained with general anesthetic, patient was  placed on the operating room and the right leg was prepped and draped in the  usual sterile fashion.  Following this, routine arthroscopic examination of  the knee revealed that there was no significant meniscal tear on the medial  side.  We probed back at length to top and bottom and there was no  significant meniscal tear which could be displaced under the knee.  The  medial femoral condyle was then evaluated and noted to have some flaps of  articular cartilage which were debrided.  Attention was  turned to the notch  where the anterior cruciate ligament deficiency was identified as well as an  osteocartilaginous loose body.  The lateral compartment was without  significant abnormality. Attention was then turned to the patellofemoral  joint where there was noted to be some significant grade 3 chondromalacia  which was again debrided by way of chondroplasty.  The knee was then  copiously irrigated and suctioned dry.  Arthroscopic portals were closed  with a stitch and then 20 cc  of Marcaine with 2 cc of 80 mg per cc of DepoMedrol were instilled in the  knee for postoperative pain control and anesthesia.  Sterile compressive  dressing was applied.  The patient was taken to the recovery room where she  was noted to be in satisfactory condition.  The estimated blood loss for  this procedure was none.       JLG/MEDQ  D:  06/09/2004  T:  06/09/2004  Job:  [id number]

## 2010-09-03 ENCOUNTER — Emergency Department (HOSPITAL_COMMUNITY)
Admission: EM | Admit: 2010-09-03 | Discharge: 2010-09-03 | Disposition: A | Discharge status: Home / Self Care | Payer: Medicare Other | Attending: Emergency Medicine | Admitting: Emergency Medicine

## 2010-09-03 ENCOUNTER — Emergency Department (HOSPITAL_COMMUNITY): Payer: Medicare Other

## 2010-09-03 DIAGNOSIS — Y92009 Unspecified place in unspecified non-institutional (private) residence as the place of occurrence of the external cause: Secondary | ICD-10-CM | POA: Insufficient documentation

## 2010-09-03 DIAGNOSIS — R51 Headache: Secondary | ICD-10-CM | POA: Insufficient documentation

## 2010-09-03 DIAGNOSIS — B372 Candidiasis of skin and nail: Secondary | ICD-10-CM | POA: Insufficient documentation

## 2010-09-03 DIAGNOSIS — R6889 Other general symptoms and signs: Secondary | ICD-10-CM | POA: Insufficient documentation

## 2010-09-03 DIAGNOSIS — W010XXA Fall on same level from slipping, tripping and stumbling without subsequent striking against object, initial encounter: Secondary | ICD-10-CM | POA: Insufficient documentation

## 2010-09-03 DIAGNOSIS — E1169 Type 2 diabetes mellitus with other specified complication: Secondary | ICD-10-CM | POA: Insufficient documentation

## 2010-09-03 DIAGNOSIS — S0990XA Unspecified injury of head, initial encounter: Secondary | ICD-10-CM | POA: Insufficient documentation

## 2010-09-03 DIAGNOSIS — Z794 Long term (current) use of insulin: Secondary | ICD-10-CM | POA: Insufficient documentation

## 2010-09-03 DIAGNOSIS — E7800 Pure hypercholesterolemia, unspecified: Secondary | ICD-10-CM | POA: Insufficient documentation

## 2010-09-03 DIAGNOSIS — M546 Pain in thoracic spine: Secondary | ICD-10-CM | POA: Insufficient documentation

## 2010-09-03 LAB — DIFFERENTIAL
Basophils Absolute: 0.1 10*3/uL (ref 0.0–0.1)
Basophils Relative: 0 % (ref 0–1)
Eosinophils Absolute: 0.2 10*3/uL (ref 0.0–0.7)
Lymphs Abs: 1.6 10*3/uL (ref 0.7–4.0)
Neutrophils Relative %: 81 % — ABNORMAL HIGH (ref 43–77)

## 2010-09-03 LAB — CBC
Platelets: 231 10*3/uL (ref 150–400)
RBC: 4.93 MIL/uL (ref 3.87–5.11)
RDW: 19.1 % — ABNORMAL HIGH (ref 11.5–15.5)
WBC: 12.9 10*3/uL — ABNORMAL HIGH (ref 4.0–10.5)

## 2010-09-03 LAB — BASIC METABOLIC PANEL
CO2: 25 mEq/L (ref 19–32)
Chloride: 100 mEq/L (ref 96–112)
GFR calc Af Amer: >60 mL/min (ref >60)
Sodium: 136 mEq/L (ref 135–145)

## 2010-09-03 LAB — URINE MICROSCOPIC-ADD ON

## 2010-09-03 LAB — URINALYSIS, ROUTINE W REFLEX MICROSCOPIC
Glucose, UA: >1000 mg/dL — AB
Leukocytes, UA: NEGATIVE
Nitrite: NEGATIVE
Protein, ur: NEGATIVE mg/dL
pH: 5 (ref 5.0–8.0)

## 2010-09-03 LAB — GLUCOSE, CAPILLARY: Glucose-Capillary: 306 mg/dL — ABNORMAL HIGH (ref 70–99)

## 2010-09-04 LAB — URINE CULTURE: Colony Count: 25000

## 2010-09-06 ENCOUNTER — Emergency Department (HOSPITAL_COMMUNITY): Payer: Medicare Other

## 2010-09-06 ENCOUNTER — Inpatient Hospital Stay (HOSPITAL_COMMUNITY)
Admission: EM | Admit: 2010-09-06 | Discharge: 2010-09-09 | DRG: 607 | Disposition: A | Discharge status: Skilled Nursing Facility | Payer: Medicare Other | Attending: Internal Medicine | Admitting: Internal Medicine

## 2010-09-06 DIAGNOSIS — Z79899 Other long term (current) drug therapy: Secondary | ICD-10-CM

## 2010-09-06 DIAGNOSIS — IMO0001 Reserved for inherently not codable concepts without codable children: Secondary | ICD-10-CM | POA: Diagnosis present

## 2010-09-06 DIAGNOSIS — Z794 Long term (current) use of insulin: Secondary | ICD-10-CM

## 2010-09-06 DIAGNOSIS — R32 Unspecified urinary incontinence: Secondary | ICD-10-CM | POA: Diagnosis present

## 2010-09-06 DIAGNOSIS — J449 Chronic obstructive pulmonary disease, unspecified: Secondary | ICD-10-CM | POA: Diagnosis present

## 2010-09-06 DIAGNOSIS — I10 Essential (primary) hypertension: Secondary | ICD-10-CM | POA: Diagnosis present

## 2010-09-06 DIAGNOSIS — R609 Edema, unspecified: Secondary | ICD-10-CM | POA: Diagnosis present

## 2010-09-06 DIAGNOSIS — B372 Candidiasis of skin and nail: Secondary | ICD-10-CM | POA: Diagnosis present

## 2010-09-06 DIAGNOSIS — I872 Venous insufficiency (chronic) (peripheral): Secondary | ICD-10-CM | POA: Diagnosis present

## 2010-09-06 DIAGNOSIS — Z87440 Personal history of urinary (tract) infections: Secondary | ICD-10-CM

## 2010-09-06 DIAGNOSIS — L270 Generalized skin eruption due to drugs and medicaments taken internally: Principal | ICD-10-CM | POA: Diagnosis present

## 2010-09-06 DIAGNOSIS — T3795XA Adverse effect of unspecified systemic anti-infective and antiparasitic, initial encounter: Secondary | ICD-10-CM | POA: Diagnosis present

## 2010-09-06 DIAGNOSIS — G4730 Sleep apnea, unspecified: Secondary | ICD-10-CM | POA: Diagnosis present

## 2010-09-06 DIAGNOSIS — Z7982 Long term (current) use of aspirin: Secondary | ICD-10-CM

## 2010-09-06 DIAGNOSIS — E785 Hyperlipidemia, unspecified: Secondary | ICD-10-CM | POA: Diagnosis present

## 2010-09-06 DIAGNOSIS — L89109 Pressure ulcer of unspecified part of back, unspecified stage: Secondary | ICD-10-CM | POA: Diagnosis present

## 2010-09-06 DIAGNOSIS — J4489 Other specified chronic obstructive pulmonary disease: Secondary | ICD-10-CM | POA: Diagnosis present

## 2010-09-06 DIAGNOSIS — L8991 Pressure ulcer of unspecified site, stage 1: Secondary | ICD-10-CM | POA: Diagnosis present

## 2010-09-06 DIAGNOSIS — Z87891 Personal history of nicotine dependence: Secondary | ICD-10-CM

## 2010-09-06 LAB — CBC
Platelets: 293 10*3/uL (ref 150–400)
RBC: 5.3 MIL/uL — ABNORMAL HIGH (ref 3.87–5.11)
WBC: 13.9 10*3/uL — ABNORMAL HIGH (ref 4.0–10.5)

## 2010-09-06 LAB — URINALYSIS, ROUTINE W REFLEX MICROSCOPIC
Glucose, UA: NEGATIVE mg/dL
Ketones, ur: NEGATIVE mg/dL
pH: 6 (ref 5.0–8.0)

## 2010-09-06 LAB — BASIC METABOLIC PANEL
CO2: 29 mEq/L (ref 19–32)
Chloride: 105 mEq/L (ref 96–112)
Sodium: 142 mEq/L (ref 135–145)

## 2010-09-06 LAB — DIFFERENTIAL
Basophils Absolute: 0.1 10*3/uL (ref 0.0–0.1)
Basophils Relative: 0 % (ref 0–1)
Eosinophils Absolute: 0.6 10*3/uL (ref 0.0–0.7)
Neutrophils Relative %: 75 % (ref 43–77)

## 2010-09-06 LAB — URINE MICROSCOPIC-ADD ON

## 2010-09-06 LAB — GLUCOSE, CAPILLARY
Glucose-Capillary: 104 mg/dL — ABNORMAL HIGH (ref 70–99)
Glucose-Capillary: 373 mg/dL — ABNORMAL HIGH (ref 70–99)

## 2010-09-06 LAB — POCT I-STAT TROPONIN I: Troponin i, poc: 0.02 ng/mL (ref 0.00–0.08)

## 2010-09-06 NOTE — H&P (Signed)
Denise Lee, Denise Lee            ACCOUNT NO.:  [id number]  MEDICAL RECORD NO.:  [id number]  LOCATION:  [facility]                         FACILITY:  [facility]  PHYSICIAN:  Andreas Blower, MD       DATE OF BIRTH:  Oct 04, 1940  DATE OF ADMISSION:  09/06/2010 DATE OF DISCHARGE:                             HISTORY & PHYSICAL   PRIMARY CARE PHYSICIAN:  Noberto Retort, MD  CHIEF COMPLAINT:  Skin rash.  HISTORY OF PRESENT ILLNESS:  Denise Lee is a 70 year old Caucasian female with history of asthma, COPD, type 2 diabetes, hypertension, morbid obesity, sleep apnea, who presents with the above complaints. She reports that she was in her usual state of health about 10 days ago. She had gone to Dr. Tiburcio Pea' office for her general checkup.  Had a UA done at that time which showed that she had a urinary tract infection and was started on a 10-day course of antibiotics.  She had a UA done because the patient was incontinent of urine.  She woke up this morning and noted rash on her chest on back and leg.  As a result presented to the ER for further evaluation.  The patient is very weak and has been living with her daughter, who cannot take care of her at this time.  As a result, the Hospitalist Service was asked to admit the patient for further management as well as help manage her skin rash.  The patient denies any recent fevers, chills, nausea, vomiting.  Denies any chest pain.  Denies any shortness of breath.  Denies any abdominal pain, diarrhea.  Denies any headaches or vision changes.  REVIEW OF SYSTEMS:  All systems were reviewed with the patient and was positive as per HPI; otherwise, all other systems are negative.  PAST MEDICAL HISTORY: 1. History of COPD. 2. Hypertension. 3. Type 2 diabetes. 4. Hyperlipidemia. 5. Morbid obesity. 6. History of sleep apnea. 7. History of carpal tunnel surgery. 8. Knee surgery. 9. Frequent history of urinary tract infection. 10.Morbid  obesity.  SOCIAL HISTORY:  The patient quit smoking about 2 years ago.  Does not drink any alcohol.  Lives at home with daughter.  Uses a power chair to get around due to her inability.  FAMILY HISTORY:  Brother had esophageal cancer.  Mother had cirrhosis.  HOME MEDICATIONS:  To be accurately reconciled by Pharmacy.  PHYSICAL EXAMINATION:  VITAL SIGNS:  Temperature 97.8, blood pressure 147/72, heart rate 57, respirations 20, satting at 95% on room air. GENERAL:  The patient was alert and oriented, not appeared to be in acute distress.  Was lying in bed comfortably. HEENT: Extraocular motions are intact.  Pupils are equal, round.  Had moist mucous membranes. NECK:  Supple. HEART:  Regular with S1 and S2. LUNGS:  Clear to auscultation bilaterally. ABDOMEN:  Soft, nontender, nondistended.  Positive bowel sounds. EXTREMITIES:  Good peripheral pulses.  The patient has generalized rash in lower extremities.  In the left lower extremity, the patient has red erythema, it is macular rash, but the patient has chronic venous stasis changes on the right with a cut that is oozing serous fluid. SKIN:  The patient has generalized macular rash on chest,  arms, legs, and back.  RADIOLOGY/IMAGING:  The patient had portable chest x-ray which showed mild interstitial prominence, may be due to edema, a component of chronicity is query.  LABORATORY DATA:  CBC shows a white count of 13.9, hemoglobin 12.3, hematocrit 40.3, platelet count 293.  Electrolytes normal with a BUN of 10, creatinine less than 0.47.  UA is negative for nitrites and leukocytes.  ASSESSMENT AND PLAN: 1. Skin rash, likely due to allergic reaction from ciprofloxacin.     Monitor for now.  We will add CIPROFLOXACIN as a drug allergy.     Suspect that some of the changes in lower extremities, especially     in the right appears to be chronic.  I do not suspect the patient     has cellulitis at this time, but we will monitor  carefully.  If the     redness worsens or if the patient has fever or any other signs to     suggest an infection, would will consider starting the patient on     antibiotics.  The patient received a dose of Unasyn in the     emergency department.  The rash on the left lower extremity appears     to be related to the macular rash from the allergic reaction.     Monitor for now.  We will not start her on any steroids.  We will     have her on a H2 blocker.  Depending on the course if the rash is     not improving, may consider getting a dermatology evaluation. 2. Type 2 diabetes.  We will continue Lantus.  We will have her on     sliding scale insulin. 3. Lower extremity edema.  Appears to be from chronic venous stasis     changes. 4. Hypertension.  The patient will be started on hydrochlorothiazide     which was started by Dr. Tiburcio Pea but has not been taking it. 5. Hyperlipidemia.  Resume home medications once known. 6. Recent urinary tract infection.  Completed course of ciprofloxacin. 7. Morbid obesity. Management as outpatient. 8. Prophylaxis.  Lovenox for DVT prophylaxis. 9. Code status.  The patient is full code.  Time spent on admission, talking to the patient, family, and coordinating care was 1 hour.   Andreas Blower, MD   SR/MEDQ  D:  09/06/2010  T:  09/06/2010  Job:  [id number]  Electronically Signed by Wardell Heath Lisha Vitale  on 09/06/2010 08:26:08 PM

## 2010-09-07 DIAGNOSIS — R609 Edema, unspecified: Secondary | ICD-10-CM

## 2010-09-07 LAB — CBC
Hemoglobin: 11.8 g/dL — ABNORMAL LOW (ref 12.0–15.0)
MCH: 23.1 pg — ABNORMAL LOW (ref 26.0–34.0)
MCHC: 30.4 g/dL (ref 30.0–36.0)
MCV: 75.9 fL — ABNORMAL LOW (ref 78.0–100.0)

## 2010-09-07 LAB — BASIC METABOLIC PANEL
BUN: 14 mg/dL (ref 6–23)
CO2: 26 mEq/L (ref 19–32)
Calcium: 9.5 mg/dL (ref 8.4–10.5)
Creatinine, Ser: <0.47 mg/dL — ABNORMAL LOW (ref 0.50–1.10)

## 2010-09-07 LAB — GLUCOSE, CAPILLARY
Glucose-Capillary: 400 mg/dL — ABNORMAL HIGH (ref 70–99)
Glucose-Capillary: 347 mg/dL — ABNORMAL HIGH (ref 70–99)
Glucose-Capillary: 304 mg/dL — ABNORMAL HIGH (ref 70–99)

## 2010-09-07 LAB — URINE CULTURE: Culture: NO GROWTH

## 2010-09-07 LAB — HEMOGLOBIN A1C: Hgb A1c MFr Bld: 11 % — ABNORMAL HIGH (ref <5.7)

## 2010-09-08 LAB — COMPREHENSIVE METABOLIC PANEL
Albumin: 3.2 g/dL — ABNORMAL LOW (ref 3.5–5.2)
Alkaline Phosphatase: 73 U/L (ref 39–117)
BUN: 14 mg/dL (ref 6–23)
Chloride: 101 mEq/L (ref 96–112)
Glucose, Bld: 268 mg/dL — ABNORMAL HIGH (ref 70–99)
Potassium: 3.7 mEq/L (ref 3.5–5.1)
Total Bilirubin: 0.3 mg/dL (ref 0.3–1.2)

## 2010-09-08 LAB — CBC
HCT: 37.4 % (ref 36.0–46.0)
Hemoglobin: 11.4 g/dL — ABNORMAL LOW (ref 12.0–15.0)
RDW: 19.6 % — ABNORMAL HIGH (ref 11.5–15.5)
WBC: 13.5 10*3/uL — ABNORMAL HIGH (ref 4.0–10.5)

## 2010-09-08 LAB — GLUCOSE, CAPILLARY
Glucose-Capillary: 268 mg/dL — ABNORMAL HIGH (ref 70–99)
Glucose-Capillary: 271 mg/dL — ABNORMAL HIGH (ref 70–99)

## 2010-09-09 LAB — GLUCOSE, CAPILLARY: Glucose-Capillary: 184 mg/dL — ABNORMAL HIGH (ref 70–99)

## 2010-09-13 LAB — CULTURE, BLOOD (ROUTINE X 2)
Culture  Setup Time: 201208282016
Culture: NO GROWTH

## 2010-09-27 NOTE — Discharge Summary (Signed)
Denise Lee, Denise Lee            ACCOUNT NO.:  [id number]  MEDICAL RECORD NO.:  [id number]  LOCATION:  5530                         FACILITY:  [facility]  PHYSICIAN:  Thad Ranger, MD       DATE OF BIRTH:  [birth year]  DATE OF ADMISSION:  09/06/2010 DATE OF DISCHARGE:  09/09/2010                        DISCHARGE SUMMARY - REFERRING   PRIMARY CARE PHYSICIAN:  Noberto Retort, MD  DISCHARGE DIAGNOSES: 1. Body-wide rash felt to be either a Cipro allergy or severe      candidiasis, which was aggravated by Cipro. 2. Urinary incontinence. 3. Uncontrolled diabetes. 4. Stasis dermatitis on her lower extremities bilaterally. 5. Hyperlipidemia. 6. Hypertension. 7. History of chronic obstructive pulmonary disease. 8. History of morbid obesity. 9. History of sleep apnea. 10.History of carpal tunnel surgery, knee surgery.  HISTORY AND BRIEF HOSPITAL COURSE:  Denise Lee is a 70 year old Caucasian female with history noted above, who presented on the September 06, 2010 to the emergency department with a severe rash on her chest, back, and legs.  She reported that she had an urinary tract infection and was prescribed Cipro 10 days earlier.  She was on day 10 of her treatment when she woke up this morning with a dark red macular rash that extensively cover her chest, breasts, arms, back, lower abdomen, and thighs.  She was admitted to the [facility] for possible drug reaction to Cipro.  She was not started on any antibiotics.  She was started on Diflucan and nystatin powder.  On my exam, the day after admission, the patient's rash was bright red, it was confluent in areas over her breasts, thighs, and she had dark red skin irritation in the folds under her breasts, under her arms, and in her vulva vaginal area, also her backside was deep red in color and appeared to be damp with urine.  Further, the patient had swelling in her lower extremities.  She has had a history of chronic  wounds, although they have healed.  She does have chronic stasis dermatitis on her lower extremities bilaterally.  The patient's son and daughter reported that they were unable to care for their mother at home anymore and they desired skilled nursing home placement.  The patient herself was agreeable to this as well.  During her hospitalization, she was treated with IV Diflucan.  A Foley catheter was placed in order to prevent urine from irritating her skin further.  Her rash has improved even during her brief hospital stay as this is more light pink in color and appears less aggravated.  For her lower extremity swelling, the patient did have venous Doppler ultrasound.  There was no evidence of deep vein thrombosis or superficial thrombosis in either her left or right extremity.  Physical therapy and occupational therapy evaluated the patient during her hospital stay and recommended skilled nursing facility rehabilitation and long-term care.  Because of the patient's sedentary lifestyle and morbid obesity, there was concern that she may be hypoxic, consequently a pulse ox was placed on the patient overnight on September 08, 2010.  O2 sats dropped down into the low 90s and high 80s while she slept.  Consequently, it was felt that the patient  may suffer from sleep apnea and may benefit from cpap in the future.  CONDITION:  At the time of discharge, the patient is alert and oriented. She is sitting up in a chair, has had a good breakfast.  Her rash is somewhat improved, but still present.  She still has some swelling of her bilateral lower extremities, but she is ready for discharge to a skilled nursing facility and in a very good humor.  LABORATORY DATA:  Pertinent lab results, the patient has had an elevated white count that does not appear to have changed much during her admission.  On September 08, 2010, her white count was 13.5, hemoglobin 11.4 with an MCV value of 76.6, platelet count  291.  Next, a BMET, September 08, 2010, sodium 137, potassium 3.7, chloride 101, bicarb 24, glucose 268, bilirubin 0.3, alk phos 73, AST 41, ALT 29, total protein 6.9, serum albumin 3.2, calcium 8.9.  Hemoglobin A1c tested on September 06, 2010 was 11.  Urinalysis showed trace leukocytes, otherwise no signs of infection.  Urine blood cultures x2 were obtained on September 07, 2010, and thus far showed no growth.  They were still preliminary in status. Urine culture showed no growth that is final.  RADIOLOGICAL EXAMS:  On September 06, 2010, the patient had a chest x-ray that showed mild interstitial prominence may be due to edema, a component of chronicity is queried.  As mentioned, the patient had bilateral lower extremity Doppler ultrasound that were negative.  DISCHARGE MEDICATIONS: 1. Acetaminophen 325 mg 1-2 tablets by mouth as needed every four     hours for pain. 2. Famotidine 40 mg 1 tablet by mouth daily. 3. Insulin sliding scale 115 units subcutaneously three times a day. 4. Lantus 40 units subcutaneously twice a day. 5. Lisinopril 5 mg 1 tablet by mouth daily. 6. Loratadine 10 mg by mouth 1 tablet as needed for allergies. 7. Nystatin 100,000 units per gram powder, applied to her vaginal and     rectal area 3 times a day. 8. Afrin nose spray, one spray in each nostril daily at bedtime as     needed. 9. Triamcinolone cream, one application to her distal lower     extremities twice daily. 10.Diflucan 100 mg by mouth once a day, take for 10 days. 11.Glipizide 10 mg 1 tablet by mouth twice daily with meals. 12.Allegra 180 mg 1 tablet by mouth daily as needed. 13.Aspirin 81 mg 1 tablet by mouth daily. 14.Cranberry over-the-counter 475 mg 1-2 tablets by mouth daily. 15.Calcium carbonate with vitamin D over-the-counter 1 tablet by mouth     daily. 16.Hydrochlorothiazide 25 mg 1 tablet by mouth daily. 17.Lactobacillus acidophilus over-the-counter 2 tablets by mouth daily     with  meal. 18.Melatonin over-the-counter 1 tablet by mouth daily at bedtime as     needed for sleep. 19.Metformin 500 mg 2 tablets by mouth twice daily with meals. 20.Simvastatin 40 mg 1 tablet by mouth daily.  DISCHARGE INSTRUCTIONS: 1. The patient will be discharged to skilled nursing facility.     Activity will be per skilled nursing facility and physical therapy.     Diet:  Diabetic diet. 2. Wound care, the patient has a shallow ulceration to her right lower     extremity, which is currently bandaged and will need bandage     changes as appropriate as seen by Wound Care at the skilled nursing     facility. 3. The patient currently has a Foley catheter in place.  This is  to     protect her skin from urine for the short term until it heals.     This Foley catheter should be discontinued on September 14, 2010. 4. Suggestions for followup, the patient has uncontrolled diabetes,     which will be more appropriately managed at skilled nursing     facility.  Also, she has high blood pressure.  She has been on     medication for this, but during her hospitalization lisinopril was     added.     Stephani Police, PA   ______________________________ Thad Ranger, MD    MLY/MEDQ  D:  09/09/2010  T:  09/09/2010  Job:  [id number]  cc:   Melida Quitter, M.D.  Electronically Signed by Algis Downs PA on 09/10/2010 09:10:52 AM Electronically Signed by Andres Labrum Tianne Plott  on 09/27/2010 03:41:33 PM

## 2010-12-01 ENCOUNTER — Emergency Department (HOSPITAL_COMMUNITY): Payer: Medicare Other

## 2010-12-01 ENCOUNTER — Encounter: Payer: Self-pay | Admitting: Emergency Medicine

## 2010-12-01 ENCOUNTER — Inpatient Hospital Stay (HOSPITAL_COMMUNITY)
Admission: EM | Admit: 2010-12-01 | Discharge: 2010-12-08 | DRG: 871 | Disposition: A | Discharge status: Home Health | Payer: Medicare Other | Attending: Internal Medicine | Admitting: Internal Medicine

## 2010-12-01 DIAGNOSIS — J449 Chronic obstructive pulmonary disease, unspecified: Secondary | ICD-10-CM | POA: Diagnosis present

## 2010-12-01 DIAGNOSIS — A419 Sepsis, unspecified organism: Principal | ICD-10-CM | POA: Diagnosis present

## 2010-12-01 DIAGNOSIS — G629 Polyneuropathy, unspecified: Secondary | ICD-10-CM | POA: Diagnosis present

## 2010-12-01 DIAGNOSIS — Z96659 Presence of unspecified artificial knee joint: Secondary | ICD-10-CM

## 2010-12-01 DIAGNOSIS — R0902 Hypoxemia: Secondary | ICD-10-CM | POA: Diagnosis present

## 2010-12-01 DIAGNOSIS — E119 Type 2 diabetes mellitus without complications: Secondary | ICD-10-CM | POA: Diagnosis present

## 2010-12-01 DIAGNOSIS — N390 Urinary tract infection, site not specified: Secondary | ICD-10-CM

## 2010-12-01 DIAGNOSIS — E876 Hypokalemia: Secondary | ICD-10-CM | POA: Diagnosis present

## 2010-12-01 DIAGNOSIS — G9340 Encephalopathy, unspecified: Secondary | ICD-10-CM | POA: Diagnosis present

## 2010-12-01 HISTORY — DX: Obstructive sleep apnea (adult) (pediatric): G47.33

## 2010-12-01 HISTORY — DX: Morbid (severe) obesity due to excess calories: E66.01

## 2010-12-01 HISTORY — DX: Essential (primary) hypertension: I10

## 2010-12-01 HISTORY — DX: Chronic obstructive pulmonary disease, unspecified: J44.9

## 2010-12-01 HISTORY — DX: Venous insufficiency (chronic) (peripheral): I87.2

## 2010-12-01 HISTORY — DX: Hyperlipidemia, unspecified: E78.5

## 2010-12-01 LAB — URINALYSIS, ROUTINE W REFLEX MICROSCOPIC
Glucose, UA: NEGATIVE mg/dL
Specific Gravity, Urine: 1.02 (ref 1.005–1.030)

## 2010-12-01 LAB — COMPREHENSIVE METABOLIC PANEL
ALT: 9 U/L (ref 0–35)
Albumin: 3 g/dL — ABNORMAL LOW (ref 3.5–5.2)
Alkaline Phosphatase: 77 U/L (ref 39–117)
BUN: 16 mg/dL (ref 6–23)
Chloride: 97 mEq/L (ref 96–112)
Potassium: 3 mEq/L — ABNORMAL LOW (ref 3.5–5.1)
Sodium: 135 mEq/L (ref 135–145)
Total Bilirubin: 0.4 mg/dL (ref 0.3–1.2)

## 2010-12-01 LAB — DIFFERENTIAL
Lymphocytes Relative: 8 % — ABNORMAL LOW (ref 12–46)
Lymphs Abs: 1.5 10*3/uL (ref 0.7–4.0)
Monocytes Relative: 4 % (ref 3–12)
Neutro Abs: 16.2 10*3/uL — ABNORMAL HIGH (ref 1.7–7.7)
Neutrophils Relative %: 88 % — ABNORMAL HIGH (ref 43–77)

## 2010-12-01 LAB — MRSA PCR SCREENING: MRSA by PCR: POSITIVE — AB

## 2010-12-01 LAB — HEMOGLOBIN A1C: Mean Plasma Glucose: 177 mg/dL — ABNORMAL HIGH (ref <117)

## 2010-12-01 LAB — CBC
Hemoglobin: 11.1 g/dL — ABNORMAL LOW (ref 12.0–15.0)
RBC: 4.47 MIL/uL (ref 3.87–5.11)
WBC: 18.4 10*3/uL — ABNORMAL HIGH (ref 4.0–10.5)

## 2010-12-01 LAB — URINE MICROSCOPIC-ADD ON

## 2010-12-01 LAB — LACTIC ACID, PLASMA: Lactic Acid, Venous: 2.1 mmol/L (ref 0.5–2.2)

## 2010-12-01 MED ORDER — SODIUM CHLORIDE 0.9 % IV SOLN: INTRAVENOUS | Status: DC

## 2010-12-01 MED ORDER — POTASSIUM CHLORIDE CRYS ER 20 MEQ PO TBCR
40.0000 meq | EXTENDED_RELEASE_TABLET | Freq: Two times a day (BID) | ORAL | Status: DC
  Administered 2010-12-01 – 2010-12-05 (×9): 40 meq via ORAL
  Administered 2010-12-06: 20 meq via ORAL
  Administered 2010-12-06 – 2010-12-07 (×3): 40 meq via ORAL
  Filled 2010-12-01 (×16): qty 2

## 2010-12-01 MED ORDER — SENNA 8.6 MG PO TABS: 2.0000 | ORAL_TABLET | Freq: Every day | ORAL | Status: DC | PRN

## 2010-12-01 MED ORDER — ACETAMINOPHEN 325 MG PO TABS
650.0000 mg | ORAL_TABLET | Freq: Four times a day (QID) | ORAL | Status: DC | PRN
  Administered 2010-12-03 – 2010-12-05 (×4): 650 mg via ORAL
  Filled 2010-12-01 (×4): qty 2

## 2010-12-01 MED ORDER — ONDANSETRON HCL 4 MG/2ML IJ SOLN: 4.0000 mg | Freq: Four times a day (QID) | INTRAMUSCULAR | Status: DC | PRN

## 2010-12-01 MED ORDER — ALUM & MAG HYDROXIDE-SIMETH 200-200-20 MG/5ML PO SUSP: 30.0000 mL | Freq: Four times a day (QID) | ORAL | Status: DC | PRN

## 2010-12-01 MED ORDER — FAMOTIDINE 40 MG PO TABS
40.0000 mg | ORAL_TABLET | Freq: Every day | ORAL | Status: DC
  Administered 2010-12-01 – 2010-12-07 (×7): 40 mg via ORAL
  Filled 2010-12-01 (×8): qty 1

## 2010-12-01 MED ORDER — SODIUM CHLORIDE 0.9 % IV SOLN
INTRAVENOUS | Status: DC
  Administered 2010-12-02 (×2): via INTRAVENOUS

## 2010-12-01 MED ORDER — ONDANSETRON HCL 4 MG PO TABS: 4.0000 mg | ORAL_TABLET | Freq: Four times a day (QID) | ORAL | Status: DC | PRN

## 2010-12-01 MED ORDER — SIMVASTATIN 20 MG PO TABS
20.0000 mg | ORAL_TABLET | Freq: Every day | ORAL | Status: DC
  Administered 2010-12-01 – 2010-12-07 (×7): 20 mg via ORAL
  Filled 2010-12-01 (×8): qty 1

## 2010-12-01 MED ORDER — PIPERACILLIN SOD-TAZOBACTAM SO 2.25 (2-0.25) G IV SOLR
3.3750 g | Freq: Once | INTRAVENOUS | Status: AC
  Administered 2010-12-01: 3.375 g via INTRAVENOUS
  Filled 2010-12-01: qty 3.38

## 2010-12-01 MED ORDER — SODIUM CHLORIDE 0.9 % IV BOLUS (SEPSIS)
1000.0000 mL | Freq: Once | INTRAVENOUS | Status: AC
  Administered 2010-12-01: 1000 mL via INTRAVENOUS

## 2010-12-01 MED ORDER — INSULIN ASPART 100 UNIT/ML ~~LOC~~ SOLN
0.0000 [IU] | Freq: Three times a day (TID) | SUBCUTANEOUS | Status: DC
  Administered 2010-12-02 (×2): 2 [IU] via SUBCUTANEOUS
  Administered 2010-12-03: 3 [IU] via SUBCUTANEOUS
  Administered 2010-12-03: 5 [IU] via SUBCUTANEOUS
  Administered 2010-12-04 (×3): 2 [IU] via SUBCUTANEOUS
  Administered 2010-12-05: 5 [IU] via SUBCUTANEOUS
  Administered 2010-12-05: 3 [IU] via SUBCUTANEOUS
  Administered 2010-12-05: 2 [IU] via SUBCUTANEOUS
  Administered 2010-12-06 (×2): 3 [IU] via SUBCUTANEOUS
  Administered 2010-12-06 – 2010-12-07 (×3): 2 [IU] via SUBCUTANEOUS
  Administered 2010-12-08: 3 [IU] via SUBCUTANEOUS
  Filled 2010-12-01: qty 3

## 2010-12-01 MED ORDER — MUPIROCIN 2 % EX OINT
1.0000 "application " | TOPICAL_OINTMENT | Freq: Two times a day (BID) | CUTANEOUS | Status: AC
  Administered 2010-12-01 – 2010-12-06 (×10): 1 via NASAL
  Filled 2010-12-01: qty 22

## 2010-12-01 MED ORDER — ACETAMINOPHEN 650 MG RE SUPP
1300.0000 mg | Freq: Once | RECTAL | Status: AC
  Administered 2010-12-01: 1300 mg via RECTAL
  Filled 2010-12-01: qty 2

## 2010-12-01 MED ORDER — ACETAMINOPHEN 650 MG RE SUPP: 650.0000 mg | Freq: Four times a day (QID) | RECTAL | Status: DC | PRN

## 2010-12-01 MED ORDER — INSULIN GLARGINE 100 UNIT/ML ~~LOC~~ SOLN
20.0000 [IU] | Freq: Every day | SUBCUTANEOUS | Status: DC
  Administered 2010-12-01 – 2010-12-02 (×2): 20 [IU] via SUBCUTANEOUS
  Filled 2010-12-01: qty 3

## 2010-12-01 MED ORDER — PIPERACILLIN-TAZOBACTAM 3.375 G IVPB
3.3750 g | Freq: Three times a day (TID) | INTRAVENOUS | Status: DC
  Administered 2010-12-01 – 2010-12-04 (×8): 3.375 g via INTRAVENOUS
  Filled 2010-12-01 (×13): qty 50

## 2010-12-01 MED ORDER — VANCOMYCIN HCL IN DEXTROSE 1-5 GM/200ML-% IV SOLN
1000.0000 mg | Freq: Once | INTRAVENOUS | Status: AC
  Administered 2010-12-01: 1000 mg via INTRAVENOUS
  Filled 2010-12-01: qty 200

## 2010-12-01 MED ORDER — SODIUM CHLORIDE 0.9 % IV SOLN
INTRAVENOUS | Status: DC
  Administered 2010-12-01: 12:00:00 via INTRAVENOUS

## 2010-12-01 MED ORDER — CHLORHEXIDINE GLUCONATE CLOTH 2 % EX PADS
6.0000 | MEDICATED_PAD | Freq: Every day | CUTANEOUS | Status: AC
  Administered 2010-12-02 – 2010-12-06 (×5): 6 via TOPICAL

## 2010-12-01 MED ORDER — CITALOPRAM HYDROBROMIDE 20 MG PO TABS
20.0000 mg | ORAL_TABLET | Freq: Every day | ORAL | Status: DC
  Administered 2010-12-01 – 2010-12-07 (×7): 20 mg via ORAL
  Filled 2010-12-01 (×8): qty 1

## 2010-12-01 MED ORDER — GLIPIZIDE 10 MG PO TABS
10.0000 mg | ORAL_TABLET | Freq: Two times a day (BID) | ORAL | Status: DC
  Administered 2010-12-02 – 2010-12-08 (×11): 10 mg via ORAL
  Filled 2010-12-01 (×14): qty 1

## 2010-12-01 NOTE — ED Provider Notes (Signed)
History     CSN: [id number] Arrival date & time: 12/01/2010  9:45 AM   First MD Initiated Contact with Patient 12/01/10 1113      Chief Complaint  Patient presents with  . Fever    Sent from [facility] for fever of 102 with nausea and vomiting---Also noted her pulse ox to be 84% on room air   Level V caveat the patient confused (Consider location/radiation/quality/duration/timing/severity/associated sxs/prior treatment) HPI Complains of fever several episodes of vomiting the past 2 days. Patient presently states she feels well. Denies pain anywhere. EMS treated patient with supplemental noted pulse oximetry to be 84% on room air Past Medical History  Diagnosis Date  . COPD (chronic obstructive pulmonary disease)   . Diabetes mellitus   . Hypertension     Past Surgical History  Procedure Date  . Joint replacement     History reviewed. No pertinent family history.  History  Substance Use Topics  . Smoking status: Never Smoker   . Smokeless tobacco: Not on file  . Alcohol Use: No    OB History    Grav Para Term Preterm Abortions TAB SAB Ect Mult Living                  Review of Systems  Unable to perform ROS: Mental status change    Allergies  Codeine  Home Medications   Current Outpatient Rx  Name Route Sig Dispense Refill  . VITAMIN D 1000 UNITS PO TABS Oral Take 1,000 Units by mouth daily.      Marland Kitchen CITALOPRAM HYDROBROMIDE 20 MG PO TABS Oral Take 20 mg by mouth at bedtime.      Marland Kitchen FAMOTIDINE 40 MG PO TABS Oral Take 40 mg by mouth daily.      Marland Kitchen GLIPIZIDE 10 MG PO TABS Oral Take 10 mg by mouth 2 (two) times daily before a meal.      . HYDROCHLOROTHIAZIDE 25 MG PO TABS Oral Take 25 mg by mouth daily.      . INSULIN ASPART 100 UNIT/ML [place] SOLN Subcutaneous Inject into the skin. SLIDING SCALE     . LANTUS SOLOSTAR [place] Subcutaneous Inject 46 Units into the skin 2 (two) times daily.      . ACIDOPHILUS PO Oral Take 2 capsules by mouth daily. DOSAGE UNKNOWN     .  LISINOPRIL 5 MG PO TABS Oral Take 5 mg by mouth daily.      Marland Kitchen LORATADINE 10 MG PO CAPS Oral Take 10 mg by mouth daily. ALLERGIES     . METFORMIN HCL 500 MG PO TABS Oral Take 1,000 mg by mouth 2 (two) times daily with a meal.      . SIMVASTATIN 20 MG PO TABS Oral Take 20 mg by mouth at bedtime.        BP 150/56  Pulse 84  Temp(Src) 99.6 F (37.6 C) (Oral)  Resp 20  Ht 5\' 2"  (1.575 m)  Wt 160 lb (72.576 kg)  BMI 29.26 kg/m2  SpO2 84%  Physical Exam  Nursing note and vitals reviewed. Constitutional:       Ill appearing, hypoxic, febrile  HENT:       Mucous membranes dry  Pulmonary/Chest: She has rales.       Rales bilaterallyat bases  Abdominal: There is no tenderness.       obese  Neurological:       Mildly uncooperative with exam oriented to name and hospital does not know date moves all  extremities follows simple commands  Skin:       Right lower extremity reddened and warm at lower leg in stocking glove fashion    ED Course  Procedures (including critical care time) 3:50 PM patient sitting up in bed looks improved and with intravenous fluids and antibiotics Spoke with Dr.Goodrich MDM Plan admit step down unit  intravenous antibiotics ivFluids Oxygen therapy Labs Reviewed - No data to display No results found.   No diagnosis found.  Results for orders placed during the hospital encounter of 12/01/10  LACTIC ACID, PLASMA      Component Value Range   Lactic Acid, Venous 2.1  0.5 - 2.2 (mmol/L)  CBC      Component Value Range   WBC 18.4 (*) 4.0 - 10.5 (K/uL)   RBC 4.47  3.87 - 5.11 (MIL/uL)   Hemoglobin 11.1 (*) 12.0 - 15.0 (g/dL)   HCT 40.9 (*) 81.1 - 46.0 (%)   MCV 80.1  78.0 - 100.0 (fL)   MCH 24.8 (*) 26.0 - 34.0 (pg)   MCHC 31.0  30.0 - 36.0 (g/dL)   RDW 91.4 (*) 78.2 - 15.5 (%)   Platelets 248  150 - 400 (K/uL)  DIFFERENTIAL      Component Value Range   Neutrophils Relative 88 (*) 43 - 77 (%)   Neutro Abs 16.2 (*) 1.7 - 7.7 (K/uL)   Lymphocytes  Relative 8 (*) 12 - 46 (%)   Lymphs Abs 1.5  0.7 - 4.0 (K/uL)   Monocytes Relative 4  3 - 12 (%)   Monocytes Absolute 0.7  0.1 - 1.0 (K/uL)   Eosinophils Relative 0  0 - 5 (%)   Eosinophils Absolute 0.0  0.0 - 0.7 (K/uL)   Basophils Relative 0  0 - 1 (%)   Basophils Absolute 0.0  0.0 - 0.1 (K/uL)  URINALYSIS, ROUTINE W REFLEX MICROSCOPIC      Component Value Range   Color, Urine AMBER (*) YELLOW    Appearance TURBID (*) CLEAR    Specific Gravity, Urine 1.020  1.005 - 1.030    pH 5.5  5.0 - 8.0    Glucose, UA NEGATIVE  NEGATIVE (mg/dL)   Hgb urine dipstick MODERATE (*) NEGATIVE    Bilirubin Urine NEGATIVE  NEGATIVE    Ketones, ur NEGATIVE  NEGATIVE (mg/dL)   Protein, ur 956 (*) NEGATIVE (mg/dL)   Urobilinogen, UA 1.0  0.0 - 1.0 (mg/dL)   Nitrite NEGATIVE  NEGATIVE    Leukocytes, UA LARGE (*) NEGATIVE   COMPREHENSIVE METABOLIC PANEL      Component Value Range   Sodium 135  135 - 145 (mEq/L)   Potassium 3.0 (*) 3.5 - 5.1 (mEq/L)   Chloride 97  96 - 112 (mEq/L)   CO2 27  19 - 32 (mEq/L)   Glucose, Bld 172 (*) 70 - 99 (mg/dL)   BUN 16  6 - 23 (mg/dL)   Creatinine, Ser 2.13  0.50 - 1.10 (mg/dL)   Calcium 9.4  8.4 - 08.6 (mg/dL)   Total Protein 7.1  6.0 - 8.3 (g/dL)   Albumin 3.0 (*) 3.5 - 5.2 (g/dL)   AST 10  0 - 37 (U/L)   ALT 9  0 - 35 (U/L)   Alkaline Phosphatase 77  39 - 117 (U/L)   Total Bilirubin 0.4  0.3 - 1.2 (mg/dL)   GFR calc non Af Amer >90  >90 (mL/min)   GFR calc Af Amer >90  >90 (mL/min)  URINE MICROSCOPIC-ADD ON  Component Value Range   WBC, UA TOO NUMEROUS TO COUNT  <3 (WBC/hpf)   RBC / HPF 7-10  <3 (RBC/hpf)   Bacteria, UA MANY (*) RARE    Dg Chest Port 1 View  12/01/2010  *RADIOLOGY REPORT*  Clinical Data: Fever, weakness, shortness of breath  PORTABLE CHEST - 1 VIEW  Comparison: Portable exam 1355 hours compared to 09/06/2010  Findings: Rotated to the left. Mild enlargement of cardiac silhouette. Calcified tortuous aorta. Diffuse interstitial  infiltrates bilaterally, could represent chronic interstitial lung disease or recurrent edema or infection. No pleural effusion or pneumothorax. Bones appear demineralized.  IMPRESSION: Enlargement of cardiac silhouette with slight pulmonary vascular congestion. Chronic interstitial infiltrates which may represent chronic interstitial lung disease, mild recurrent pulmonary edema, or infection.  Original Report Authenticated By: Lollie Marrow, M.D.     MDM  Diagnosis #1 sepsis #2 UTI #3 cellulitis right leg #4 hypoxia  CRITICAL CARE Performed by: Doug Sou   Total critical care time: 30 minutes  Critical care time was exclusive of separately billable procedures and treating other patients.  Critical care was necessary to treat or prevent imminent or life-threatening deterioration.  Critical care was time spent personally by me on the following activities: development of treatment plan with patient and/or surrogate as well as nursing, discussions with consultants, evaluation of patient's response to treatment, examination of patient, obtaining history from patient or surrogate, ordering and performing treatments and interventions, ordering and review of laboratory studies, ordering and review of radiographic studies, pulse oximetry and re-evaluation of patient's condition.     Doug Sou, MD 12/01/10 [phone]

## 2010-12-01 NOTE — H&P (Signed)
History and Physical  [name] DOB: Apr 16, 1940 DOA: 12/01/2010  Referring physician: Doug Lee, M.D. PCP: Denise Bouche, MD  Also sees Denise Jenny, MD since she has been at [facility] Complaint: vomiting  HPI:  70 year old woman presented from [facility] with nausea and vomiting for 2 days. Fever 102 this morning. Oxygen saturation noted to be 84% on room air. Per emergency room documentation and discussion patient was noted to be confused, ill-appearing, dry; noted to have right lower extremity redness. Labs were notable for hypokalemia, leukocytosis. Urinalysis was grossly abnormal. Chest x-ray: chronic interstitial infiltrates which may represent chronic interstitial lung disease, mild recurrent pulmonary edema, or infection. In discussion with the emergency department physician step down bed is recommended for presumed UTI with sepsis.  The patient herself is quite pleasant but her history appears to be unreliable. She appears to be mildly confused. History is obtained from review of chart. She does report vomiting once yesterday and nausea yesterday. She denies diarrhea or abdominal pain. Her daughter has seen her today and notes that her mother is more confused than usual although she does have some baseline confusion. She notes that her mother gets confused with urinary tract infections.  Review of Systems:  Denies changes to her vision, sore throat, difficulty voiding, muscle aches, chest pain, shortness of breath, dysuria, bleeding, abdominal pain. Positive for fever, rash right lower extremity (her daughter tells me that this is chronic).  Past Medical History  Diagnosis Date  . COPD (chronic obstructive pulmonary disease)   . Diabetes mellitus   . Hypertension   . Urinary incontinence   . Stasis dermatitis   . Hyperlipidemia   . Obstructive sleep apnea     Patient denies  . Morbid obesity    Past Surgical History  Procedure Date    . Joint replacement     Knee  . Carpal tunnel release     Patient denies   Social History:  reports that she has quit smoking. She does not have any smokeless tobacco history on file. She reports that she does not drink alcohol or use illicit drugs.  Allergies  Allergen Reactions  . Codeine     Patient denies  . Ciprofloxacin Rash    Not entirely clear but this was entertained on previous admission.   Family History  Problem Relation Age of Onset  . Esophageal cancer Brother   . Cirrhosis Mother    Prior to Admission medications   Medication Sig Start Date End Date Taking? Authorizing Provider  cholecalciferol (VITAMIN D) 1000 UNITS tablet Take 1,000 Units by mouth daily.     Yes Historical Provider, MD  citalopram (CELEXA) 20 MG tablet Take 20 mg by mouth at bedtime.     Yes Historical Provider, MD  famotidine (PEPCID) 40 MG tablet Take 40 mg by mouth daily.     Yes Historical Provider, MD  glipiZIDE (GLUCOTROL) 10 MG tablet Take 10 mg by mouth 2 (two) times daily before a meal.     Yes Historical Provider, MD  hydrochlorothiazide (HYDRODIURIL) 25 MG tablet Take 25 mg by mouth daily.     Yes Historical Provider, MD  insulin aspart (NOVOLOG) 100 UNIT/ML injection Inject into the skin. SLIDING SCALE    Yes Historical Provider, MD  Insulin Glargine (LANTUS SOLOSTAR [place]) Inject 46 Units into the skin 2 (two) times daily.     Yes Historical Provider, MD  Lactobacillus (ACIDOPHILUS PO) Take 2 capsules by mouth daily. DOSAGE UNKNOWN  Yes Historical Provider, MD  lisinopril (PRINIVIL,ZESTRIL) 5 MG tablet Take 5 mg by mouth daily.     Yes Historical Provider, MD  Loratadine 10 MG CAPS Take 10 mg by mouth daily. ALLERGIES    Yes Historical Provider, MD  metFORMIN (GLUCOPHAGE) 500 MG tablet Take 1,000 mg by mouth 2 (two) times daily with a meal.     Yes Historical Provider, MD  simvastatin (ZOCOR) 20 MG tablet Take 20 mg by mouth at bedtime.     Yes Historical Provider, MD   Physical  Exam: Blood pressure 117/45, pulse 84, temperature 99.6 F (37.6 C), temperature source Oral, resp. rate 16, height 5\' 2"  (1.575 m), weight 72.576 kg (160 lb), SpO2 92.00%.101.5 rectal temperature per Dr. Ethelda Lee   General:  Appears well. Lying on a stretcher in the emergency department.  Eyes: Pupils equal round and reactive to light. Normal lids, irises the conjunctiva.  ENT: Grossly normal hearing. Normal lips and tongue.  Neck: Neck supple. No lymphadenopathy or masses. No thyromegaly.  Cardiovascular: Regular rate and rhythm. No murmur, rub or gallop.  Respiratory: Clear to auscultation bilaterally. No wheezes, rales or rhonchi. Normal respiratory effort.  Abdomen: Soft, nontender nondistended.  Skin: Confluent red rash right lower extremity just above the ankle to just below the knee. Nontender, no no edema, no warmth. No fluctuance or evidence of abscess. The left lower extremity has mild erythema of the lower extremity.  Musculoskeletal: Tone and strength in the upper and lower extremities appears to be symmetric.  Psychiatric: Oriented to herself, Denise Lee, "October", 2012, Denise Lee  Neurologic: Cranial nerves 2-12 appear grossly normal.  Labs on Admission:  Basic Metabolic Panel:  Lab 12/01/10 1610  NA 135  K 3.0*  CL 97  CO2 27  GLUCOSE 172*  BUN 16  CREATININE 0.53  CALCIUM 9.4  MG --  PHOS --   Liver Function Tests:  Lab 12/01/10 1230  AST 10  ALT 9  ALKPHOS 77  BILITOT 0.4  PROT 7.1  ALBUMIN 3.0*   CBC:  Lab 12/01/10 1230  WBC 18.4*  NEUTROABS 16.2*  HGB 11.1*  HCT 35.8*  MCV 80.1  PLT 248   Radiological Exams on Admission: Dg Chest Port 1 View  12/01/2010  *RADIOLOGY REPORT*  Clinical Data: Fever, weakness, shortness of breath  PORTABLE CHEST - 1 VIEW  Comparison: Portable exam 1355 hours compared to 09/06/2010  Findings: Rotated to the left. Mild enlargement of cardiac silhouette. Calcified tortuous aorta. Diffuse interstitial  infiltrates bilaterally, could represent chronic interstitial lung disease or recurrent edema or infection. No pleural effusion or pneumothorax. Bones appear demineralized.  IMPRESSION: Enlargement of cardiac silhouette with slight pulmonary vascular congestion. Chronic interstitial infiltrates which may represent chronic interstitial lung disease, mild recurrent pulmonary edema, or infection.  Original Report Authenticated By: Lollie Marrow, M.D.   Assessment/Plan 1. UTI with early sepsis: She has been treated with Zosyn and vancomycin in the emergency room. Given her allergy to Cipro will continue Zosyn at this time. I see no evidence of cellulitis and we will discontinue her vancomycin at this time. 2. Acute/chronic encephalopathy: Presumably secondary to the above. 3. Hypoxia: Probably secondary to COPD. Her chest x-ray was somewhat nonspecific. She has no clinical signs to suggest edema or infection. 4. 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 No evidence to suggest infection at this point. 5. Hypokalemia: Replete. 6. COPD: Oxygen therapy as needed. Her daughter tells me the patient has no history of oxygen requirement and does  not use nebulizers or inhalers. 7. Diabetes mellitus: Appears stable at this time. Continue sliding scale insulin, glipizide and Lantus. 8. Obstructive sleep apnea: Patient denies this. Monitor.  I spoke with her daughter/healthcare power of attorney by phone today. I updated her on the patient's evaluation thus far as well as my clinical impression as well as plans.  Code Status: Full Code per HCPOA Family Communication: Denise Lee (HCPOA/daughter) [phone]. Disposition Plan: Return to [facility] when improved.    Brendia Sacks, MD  [facility] Pager [phone] 12/01/2010, 3:26 PM

## 2010-12-01 NOTE — ED Notes (Signed)
Placed on 02 at 2liters and 02 sat rose to the mid 90's.

## 2010-12-01 NOTE — ED Notes (Signed)
Nausea, Vomiting for the past two days with onset of fever 102 this a.m.   Noted her 02sat to be 84% on room air--Pt. Denies any cough, SOA or discomfort of anykind

## 2010-12-01 NOTE — ED Notes (Signed)
Report called to Brittany, RN

## 2010-12-01 NOTE — ED Notes (Signed)
Oral temp 98.6--2nd blood culture has been drawn and antibiotic is infusing now

## 2010-12-01 NOTE — ED Notes (Signed)
Blood cultures drawn from left hand

## 2010-12-01 NOTE — ED Notes (Signed)
[id number]<YN> Expected date:12/01/10<BR> Expected time: 9:07 AM<BR> Means of arrival:Ambulance<BR> Comments:<BR> Patient with fever

## 2010-12-01 NOTE — Progress Notes (Signed)
ANTIBIOTIC CONSULT NOTE - INITIAL  Pharmacy Consult for Zosyn Indication: pneumonia  Allergies  Allergen Reactions  . Codeine     Patient denies  . Ciprofloxacin Rash    Not entirely clear but this was entertained on previous admission.    Patient Measurements: Height: 5\' 2"  (157.5 cm) Weight: 220 lb 14.4 oz (100.2 kg) IBW/kg (Calculated) : 50.1    Vital Signs: Temp: 98.3 F (36.8 C) (11/21 1800) Temp src: Oral (11/21 1800) BP: 116/46 mmHg (11/21 1800) Pulse Rate: 72  (11/21 1800) Intake/Output from previous day:   Intake/Output from this shift:    Labs:  Basename 12/01/10 1230  WBC 18.4*  HGB 11.1*  PLT 248  LABCREA --  CREATININE 0.53   Estimated Creatinine Clearance: 72.4 ml/min (by C-G formula based on Cr of 0.53). No results found for this basename: VANCOTROUGH:2,VANCOPEAK:2,VANCORANDOM:2,GENTTROUGH:2,GENTPEAK:2,GENTRANDOM:2,TOBRATROUGH:2,TOBRAPEAK:2,TOBRARND:2,AMIKACINPEAK:2,AMIKACINTROU:2,AMIKACIN:2, in the last 72 hours   Microbiology: No results found for this or any previous visit (from the past 720 hour(s)).  Medical History: Past Medical History  Diagnosis Date  . COPD (chronic obstructive pulmonary disease)   . Diabetes mellitus   . Hypertension   . Urinary incontinence   . Stasis dermatitis   . Hyperlipidemia   . Obstructive sleep apnea     Patient denies  . Morbid obesity     Medications:  Scheduled:    . acetaminophen  1,300 mg Rectal Once  . citalopram  20 mg Oral QHS  . famotidine  40 mg Oral Daily  . glipiZIDE  10 mg Oral BID AC  . insulin aspart  0-9 Units Subcutaneous TID WC  . insulin glargine  20 Units Subcutaneous QHS  . piperacillin-tazobactam (ZOSYN)  IV  3.375 g Intravenous Once  . piperacillin-tazobactam (ZOSYN)  IV  3.375 g Intravenous Q8H  . potassium chloride  40 mEq Oral BID  . simvastatin  20 mg Oral QHS  . sodium chloride  1,000 mL Intravenous Once  . vancomycin  1,000 mg Intravenous Once   Infusions:    .  sodium chloride 125 mL/hr at 12/01/10 1845  . DISCONTD: sodium chloride 125 mL/hr at 12/01/10 1215  . DISCONTD: sodium chloride     PRN: acetaminophen, acetaminophen, alum & mag hydroxide-simeth, ondansetron (ZOFRAN) IV, ondansetron, senna Anti-infectives     Start     Dose/Rate Route Frequency Ordered Stop   12/01/10 2000   piperacillin-tazobactam (ZOSYN) IVPB 3.375 g        3.375 g 12.5 mL/hr over 240 Minutes Intravenous 3 times per day 12/01/10 1933     12/01/10 1215   vancomycin (VANCOCIN) IVPB 1000 mg/200 mL premix        1,000 mg 200 mL/hr over 60 Minutes Intravenous  Once 12/01/10 1208 12/01/10 1652   12/01/10 1215   piperacillin-tazobactam (ZOSYN) 3.375 g in dextrose 5 % 50 mL IVPB        3.375 g 100 mL/hr over 30 Minutes Intravenous  Once 12/01/10 1208 12/01/10 1455         Assessment: 70 yo female with PNA, continue Zosyn per pharmacy  Goal of Therapy: Antibiotic dose/schedule appropriate for renal function. Monitor cultures   Plan:  Continue Zosyn at 3.375gm q8h, 4 hr infusion.  Chilton Si, Jerred Zaremba L 12/01/2010,7:34 PM

## 2010-12-02 LAB — CBC
HCT: 34 % — ABNORMAL LOW (ref 36.0–46.0)
MCHC: 30.6 g/dL (ref 30.0–36.0)
RDW: 19.6 % — ABNORMAL HIGH (ref 11.5–15.5)

## 2010-12-02 LAB — GLUCOSE, CAPILLARY
Glucose-Capillary: 172 mg/dL — ABNORMAL HIGH (ref 70–99)
Glucose-Capillary: 119 mg/dL — ABNORMAL HIGH (ref 70–99)
Glucose-Capillary: 293 mg/dL — ABNORMAL HIGH (ref 70–99)

## 2010-12-02 LAB — BASIC METABOLIC PANEL
BUN: 13 mg/dL (ref 6–23)
Calcium: 8.8 mg/dL (ref 8.4–10.5)
Chloride: 99 mEq/L (ref 96–112)
Creatinine, Ser: 0.52 mg/dL (ref 0.50–1.10)
GFR calc Af Amer: >90 mL/min (ref >90)

## 2010-12-02 MED ORDER — ENOXAPARIN SODIUM 60 MG/0.6ML ~~LOC~~ SOLN
50.0000 mg | SUBCUTANEOUS | Status: DC
  Administered 2010-12-02 – 2010-12-07 (×6): 50 mg via SUBCUTANEOUS
  Filled 2010-12-02 (×7): qty 0.6

## 2010-12-02 NOTE — Progress Notes (Signed)
Subjective: Patient states that she feels a lot better this morning. She's had no further episodes of emesis or nausea since yesterday. Patient has not eaten anything this morning but states that she is hungry. Objective: Filed Vitals:   12/02/10 0300 12/02/10 0400 12/02/10 0500 12/02/10 0700  BP: 134/57 134/57 136/51 135/46  Pulse: 83 80 80 80  Temp:  99.5 F (37.5 C)    TempSrc:  Axillary    Resp: 21 20 19 18   Height:      Weight:      SpO2: 92% 94% 94% 93%   Weight change:   Intake/Output Summary (Last 24 hours) at 12/02/10 0855 Last data filed at 12/02/10 0600  Gross per 24 hour  Intake  187.5 ml  Output    895 ml  Net -707.5 ml    General: Alert, awake, oriented x3, in no acute distress.  HEENT: [place]/AT PEERL, EOMI Neck: Trachea midline,  no masses, no thyromegal,y no JVD, no carotid bruit OROPHARYNX:  Moist, No exudate/ erythema/lesions.  Heart: Regular rate and rhythm, without murmurs, rubs, gallops, PMI non-displaced, no heaves or thrills on palpation.  Lungs: Clear to auscultation, no wheezing or rhonchi noted. No increased vocal fremitus resonant to percussion  Abdomen: Soft, nontender, nondistended, positive bowel sounds, no masses no hepatosplenomegaly noted..  Neuro: No focal neurological deficits noted cranial nerves II through XII grossly intact. DTRs 2+ bilaterally upper and lower extremities. Strength 5 out of 5 in bilateral upper and lower extremities. Musculoskeletal: No warm swelling or erythema around joints, no spinal tenderness noted. Psychiatric: Patient alert and oriented x3, good insight and cognition, good recent to remote recall. Lymph node survey: No cervical axillary or inguinal lymphadenopathy noted.     Lab Results:  [facility] 12/02/10 0310 12/01/10 1230  NA 134* 135  K 3.0* 3.0*  CL 99 97  CO2 27 27  GLUCOSE 148* 172*  BUN 13 16  CREATININE 0.52 0.53  CALCIUM 8.8 9.4  MG -- --  PHOS -- --    Basename 12/01/10 1230  AST 10  ALT 9    ALKPHOS 77  BILITOT 0.4  PROT 7.1  ALBUMIN 3.0*   No results found for this basename: LIPASE:2,AMYLASE:2 in the last 72 hours  Basename 12/02/10 0310 12/01/10 1230  WBC 13.7* 18.4*  NEUTROABS -- 16.2*  HGB 10.4* 11.1*  HCT 34.0* 35.8*  MCV 80.8 80.1  PLT 219 248   No results found for this basename: CKTOTAL:3,CKMB:3,CKMBINDEX:3,TROPONINI:3 in the last 72 hours No results found for this basename: POCBNP:3 in the last 72 hours No results found for this basename: DDIMER:2 in the last 72 hours  Basename 12/01/10 1206  HGBA1C 7.8*   No results found for this basename: CHOL:2,HDL:2,LDLCALC:2,TRIG:2,CHOLHDL:2,LDLDIRECT:2 in the last 72 hours No results found for this basename: TSH,T4TOTAL,FREET3,T3FREE,THYROIDAB in the last 72 hours No results found for this basename: VITAMINB12:2,FOLATE:2,FERRITIN:2,TIBC:2,IRON:2,RETICCTPCT:2 in the last 72 hours  Micro Results: Recent Results (from the past 240 hour(s))  CULTURE, BLOOD (ROUTINE X 2)     Status: Normal (Preliminary result)   Collection Time   12/01/10 12:15 PM      Component Value Range Status Comment   Specimen Description BLOOD RIGHT HAND   Final    Special Requests BOTTLES DRAWN AEROBIC AND ANAEROBIC 5CC   Final    Setup Time [id number]   Final    Culture     Final    Value:        BLOOD CULTURE RECEIVED NO GROWTH TO  DATE CULTURE WILL BE HELD FOR 5 DAYS BEFORE ISSUING A FINAL NEGATIVE REPORT   Report Status PENDING   Incomplete   CULTURE, BLOOD (ROUTINE X 2)     Status: Normal (Preliminary result)   Collection Time   12/01/10 12:15 PM      Component Value Range Status Comment   Specimen Description BLOOD LEFT HAND   Final    Special Requests BOTTLES DRAWN AEROBIC AND ANAEROBIC 5CC   Final    Setup Time [id number]   Final    Culture     Final    Value:        BLOOD CULTURE RECEIVED NO GROWTH TO DATE CULTURE WILL BE HELD FOR 5 DAYS BEFORE ISSUING A FINAL NEGATIVE REPORT   Report Status PENDING   Incomplete   MRSA PCR  SCREENING     Status: Abnormal   Collection Time   12/01/10  6:25 PM      Component Value Range Status Comment   MRSA by PCR POSITIVE (*) NEGATIVE  Final     Studies/Results: Dg Chest Port 1 View  12/01/2010  *RADIOLOGY REPORT*  Clinical Data: Fever, weakness, shortness of breath  PORTABLE CHEST - 1 VIEW  Comparison: Portable exam 1355 hours compared to 09/06/2010  Findings: Rotated to the left. Mild enlargement of cardiac silhouette. Calcified tortuous aorta. Diffuse interstitial infiltrates bilaterally, could represent chronic interstitial lung disease or recurrent edema or infection. No pleural effusion or pneumothorax. Bones appear demineralized.  IMPRESSION: Enlargement of cardiac silhouette with slight pulmonary vascular congestion. Chronic interstitial infiltrates which may represent chronic interstitial lung disease, mild recurrent pulmonary edema, or infection.  Original Report Authenticated By: Lollie Marrow, M.D.    Medications: I have reviewed the patient's current medications. Scheduled Meds:   . acetaminophen  1,300 mg Rectal Once  . Chlorhexidine Gluconate Cloth  6 each Topical Q0600  . citalopram  20 mg Oral QHS  . famotidine  40 mg Oral Daily  . glipiZIDE  10 mg Oral BID AC  . insulin aspart  0-9 Units Subcutaneous TID WC  . insulin glargine  20 Units Subcutaneous QHS  . mupirocin ointment  1 application Nasal BID  . piperacillin-tazobactam (ZOSYN)  IV  3.375 g Intravenous Once  . piperacillin-tazobactam (ZOSYN)  IV  3.375 g Intravenous Q8H  . potassium chloride  40 mEq Oral BID  . simvastatin  20 mg Oral QHS  . sodium chloride  1,000 mL Intravenous Once  . vancomycin  1,000 mg Intravenous Once   Continuous Infusions:   . sodium chloride 125 mL/hr at 12/02/10 0500  . DISCONTD: sodium chloride 125 mL/hr at 12/01/10 1215  . DISCONTD: sodium chloride     PRN Meds:.acetaminophen, acetaminophen, alum & mag hydroxide-simeth, ondansetron (ZOFRAN) IV, ondansetron,  senna Assessment/Plan: Patient Active Hospital Problem List: Sepsis (12/01/2010)   Assessment: Sepsis is resolved with supportive care. The patient has certainly improved clinically from the description of her clinical presentation on yesterday. We'll continue the Zosyn until the urine culture is resulted. Plan: Will decrease IV fluids to KVO until patient tolerating diet. When patient is tolerating diet well and able to maintain her by mouth and take out will DC the IV fluids.  UTI (lower urinary tract infection) (12/01/2010)   Assessment: Urinalysis consistent with urinary tract infection urine cultures are pending.    Plan: We'll continue Zosyn until urine culture results received.  Acute encephalopathy (12/01/2010)   Assessment: Acute encephalopathy resolved    Hypoxia (12/01/2010)  Assessment: Etiology is unclear. The patient denies prior  oxygen requirements. Presently the patient's O2 sats are 95% on 2 L. Will wean the oxygen.     Diabetes mellitus (12/01/2010)   Assessment: Blood sugars well controlled since hospitalization. However her hemoglobin A1c reflects some degree of uncontrolled diabetes-hemoglobin A1c of 7.8   COPD (chronic obstructive pulmonary disease) (12/01/2010)   Assessment: Appears quite hesitant at this time.    Hypokalemia (12/01/2010)   Assessment: Replete orally   DVT prophylaxis: Currently with SCDs. However patient complaining of irritating affective SCDs on the lower extremities thus will change to Lovenox for DVT prophylaxis.   Patient much improved no longer requires a step down unit. We'll transfer to a MedSurg bed.  Continue diet with carb modified.     LOS: 1 day

## 2010-12-03 DIAGNOSIS — B9620 Unspecified Escherichia coli [E. coli] as the cause of diseases classified elsewhere: Secondary | ICD-10-CM | POA: Diagnosis present

## 2010-12-03 LAB — GLUCOSE, CAPILLARY
Glucose-Capillary: 227 mg/dL — ABNORMAL HIGH (ref 70–99)
Glucose-Capillary: 208 mg/dL — ABNORMAL HIGH (ref 70–99)

## 2010-12-03 LAB — URINE CULTURE

## 2010-12-03 MED ORDER — INSULIN GLARGINE 100 UNIT/ML ~~LOC~~ SOLN
25.0000 [IU] | Freq: Every day | SUBCUTANEOUS | Status: DC
  Administered 2010-12-03 – 2010-12-04 (×2): 25 [IU] via SUBCUTANEOUS
  Filled 2010-12-03: qty 3

## 2010-12-03 MED ORDER — HYDROCHLOROTHIAZIDE 25 MG PO TABS
25.0000 mg | ORAL_TABLET | Freq: Every day | ORAL | Status: DC
  Administered 2010-12-03 – 2010-12-07 (×5): 25 mg via ORAL
  Filled 2010-12-03 (×6): qty 1

## 2010-12-03 NOTE — Progress Notes (Signed)
Physical Therapy Evaluation Patient Details Name: Denise Lee MRN: [id number] DOB: Sep 25, 1940 Today's Date: 12/03/2010 1020-1040 Problem List:  Patient Active Problem List  Diagnoses  . UTI (lower urinary tract infection)  . Sepsis  . Acute encephalopathy  . Hypoxia  . Diabetes mellitus  . COPD (chronic obstructive pulmonary disease)  . Hypokalemia    Past Medical History:  Past Medical History  Diagnosis Date  . COPD (chronic obstructive pulmonary disease)   . Diabetes mellitus   . Hypertension   . Urinary incontinence   . Stasis dermatitis   . Hyperlipidemia   . Obstructive sleep apnea     Patient denies  . Morbid obesity    Past Surgical History:  Past Surgical History  Procedure Date  . Joint replacement     Knee  . Carpal tunnel release     Patient denies    PT Assessment/Plan/Recommendation PT Assessment Clinical Impression Statement: Patient admitted with UTI and sepsis presents with decreased strength, decreased mobility, deceased activity tolerance and will benefit from skilled PT in the acute care setting to address deficits and facilitate return to facility with PT following on D/C. PT Recommendation/Assessment: Patient will need skilled PT in the acute care venue PT Problem List: Decreased strength;Decreased mobility;Decreased activity tolerance PT Therapy Diagnosis : Generalized weakness;Abnormality of gait PT Plan PT Frequency: Min 3X/week PT Treatment/Interventions: Gait training;DME instruction;Functional mobility training;Therapeutic exercise;Patient/family education PT Recommendation Follow Up Recommendations: Skilled nursing facility (from [facility] (SNF vs. ALF)) PT Goals  Acute Rehab PT Goals PT Goal Formulation: With patient Time For Goal Achievement: 2 weeks Pt will Roll Supine to Right Side: with min assist PT Goal: Rolling Supine to Right Side - Progress: Progressing toward goal Pt will go Supine/Side to Sit: with min  assist PT Goal: Supine/Side to Sit - Progress: Progressing toward goal Pt will Transfer Sit to Stand/Stand to Sit: with supervision;from elevated surface PT Transfer Goal: Sit to Stand/Stand to Sit - Progress: Progressing toward goal Pt will Transfer Bed to Chair/Chair to Bed: with supervision (with rolling walker) PT Transfer Goal: Bed to Chair/Chair to Bed - Progress: Progressing toward goal Pt will Propel Wheelchair: 51 - 150 feet;with supervision PT Goal: Propel Wheelchair - Progress: Other (comment) (not assessed on evaluation)  PT Evaluation Precautions/Restrictions  Precautions Precautions: Fall Restrictions RLE Weight Bearing:  (Pt is without any restrictions for weight bearing) LLE Weight Bearing:  (Pt is without any restrictions for weight bearing) Prior Functioning  Home Living Lives With: Alone Receives Help From: Other (Comment) (staff at [facility]) Type of Home: Skilled Nursing Facility Prior Function Level of Independence: Requires assistive device for independence;Needs assistance with ADLs;Needs assistance with tranfers;Needs assistance with gait Comments: reports not walking for about 2 years.  gets around in wheelchair mainly, due to "diabetes" Cognition Cognition Arousal/Alertness: Awake/alert Overall Cognitive Status: Impaired Memory: Appears impaired Memory Deficits: Has difficulty recalling conditions that limit her mobility (i.e "diabetes," "herniated disc") in both cases able to recall with increased time. Following Commands: Follows multi-step commands consistently Problem Solving: Requires assistance for problem solving (little irriated I got her up to chair despite encouragement) Cognition - Other Comments: Did not formally assess orientation Sensation/Coordination Sensation Light Touch: Impaired by gross assessment (both LE's, reports radicular symptoms right LE) Extremity Assessment RLE Assessment RLE Assessment: Exceptions to [facility] RLE  Strength RLE Overall Strength: Deficits RLE Overall Strength Comments: hip flexion 2+/5; knee extension 3+/5; knee flexion 3/5 LLE Assessment LLE Assessment: Exceptions to [facility] LLE Strength LLE Overall  Strength: Deficits LLE Overall Strength Comments: hip flexion 2+/5; knee extension 3+/5; knee flexion 3/5 Mobility (including Balance) Bed Mobility Bed Mobility: Yes Rolling Left: With rail;3: Mod assist Rolling Left Details (indicate cue type and reason): increased time, reverted to this technique with sitting up unsuccessful Left Sidelying to Sit: With rails;3: Mod assist Left Sidelying to Sit Details (indicate cue type and reason): 50% help to raise upper trunk (bed stuck with knees elevated and unable to cancel lock out on controls) Sitting - Scoot to Edge of Bed: 3: Mod assist Sitting - Scoot to Edge of Bed Details (indicate cue type and reason): with cues for lateral lean and assist for pulling patient forward on pad Transfers Transfers: Yes Sit to Stand: 3: Mod assist;From elevated surface;From bed;With upper extremity assist Sit to Stand Details (indicate cue type and reason): cues for hand placement and for technique Stand to Sit: With upper extremity assist;With armrests;To chair/3-in-1;4: Min assist Stand to Sit Details: cues for controlled descent and hand placement Stand Pivot Transfers: 3: Mod assist Stand Pivot Transfer Details (indicate cue type and reason): from bed to chair with rolling walker and mod assist for safety Ambulation/Gait Ambulation/Gait: No (reports not walking for couple of years)    Exercise    End of Session PT - End of Session Equipment Utilized During Treatment: Gait belt Activity Tolerance: Patient tolerated treatment well Patient left: in chair;with call bell in reach General Behavior During Session: [facility] for tasks performed Cognition: Impaired (still recovering from AMS on admission)  [facility] 12/03/2010, 11:58 AM

## 2010-12-03 NOTE — Progress Notes (Signed)
Subjective: Patient denies any further nausea and states she has been able to tolerate her meals well. She states that she feels better today than she did in the past 2 days. Patient expresses frustration with the fact that physical therapy have asked her to do things which she feels she's unable to do and has in fact gone through physical therapy before to attempt to do them. The patient states that she has no desire to go to a skilled facility and wants to return to her assisted living facility.  Interval history: Patient has been tolerating her diet since yesterday she's had no further nausea. The patient has had several bowel movements but they have been formed. She's had no abdominal pain no difficulty with breathing. Objective: Filed Vitals:   12/02/10 1734 12/02/10 2143 12/03/10 0300 12/03/10 0422  BP: 153/66 126/73  120/76  Pulse: 84 108  79  Temp: 98.9 F (37.2 C) 97.2 F (36.2 C)  96.6 F (35.9 C)  TempSrc: Oral Oral  Oral  Resp: 20 24  19   Height:      Weight:      SpO2: 92%  93% 92%   Weight change:   Intake/Output Summary (Last 24 hours) at 12/03/10 1205 Last data filed at 12/03/10 0425  Gross per 24 hour  Intake      0 ml  Output    851 ml  Net   -851 ml    General: Alert, awake, oriented x3, in no acute distress. Nontoxic-appearing HEENT: /AT PEERL, EOMI Neck: Trachea midline,  no masses, no thyromegal,y no JVD, no carotid bruit OROPHARYNX:  Moist, No exudate/ erythema/lesions.  Heart: Regular rate and rhythm, without murmurs, rubs, gallops, PMI non-displaced, no heaves or thrills on palpation.  Lungs: Clear to auscultation, no wheezing or rhonchi noted. No increased vocal fremitus resonant to percussion  Abdomen: distended, positive bowel sounds, no masses no hepatosplenomegaly noted. Ms. Giacobbe states that this is the baseline appearance of her abdomen. And in fact her abdomen has been this way since admission.  Neuro: No focal neurological deficits noted  cranial nerves II through XII grossly intact. DTRs 2+ bilaterally upper and lower extremities. Strength 5 out of 5 in bilateral upper and lower extremities. Musculoskeletal: No warm swelling or erythema around joints, no spinal tenderness noted. Psychiatric: Patient alert and oriented x3, good insight and cognition, good recent to remote recall. Lymph node survey: No cervical axillary or inguinal lymphadenopathy noted.     Lab Results:  [facility] 12/02/10 0310 12/01/10 1230  NA 134* 135  K 3.0* 3.0*  CL 99 97  CO2 27 27  GLUCOSE 148* 172*  BUN 13 16  CREATININE 0.52 0.53  CALCIUM 8.8 9.4  MG -- --  PHOS -- --    Basename 12/01/10 1230  AST 10  ALT 9  ALKPHOS 77  BILITOT 0.4  PROT 7.1  ALBUMIN 3.0*   No results found for this basename: LIPASE:2,AMYLASE:2 in the last 72 hours  Basename 12/02/10 0310 12/01/10 1230  WBC 13.7* 18.4*  NEUTROABS -- 16.2*  HGB 10.4* 11.1*  HCT 34.0* 35.8*  MCV 80.8 80.1  PLT 219 248   No results found for this basename: CKTOTAL:3,CKMB:3,CKMBINDEX:3,TROPONINI:3 in the last 72 hours No results found for this basename: POCBNP:3 in the last 72 hours No results found for this basename: DDIMER:2 in the last 72 hours  Basename 12/01/10 1206  HGBA1C 7.8*   No results found for this basename: CHOL:2,HDL:2,LDLCALC:2,TRIG:2,CHOLHDL:2,LDLDIRECT:2 in the last 72 hours No  results found for this basename: TSH,T4TOTAL,FREET3,T3FREE,THYROIDAB in the last 72 hours No results found for this basename: VITAMINB12:2,FOLATE:2,FERRITIN:2,TIBC:2,IRON:2,RETICCTPCT:2 in the last 72 hours  Micro Results: Recent Results (from the past 240 hour(s))  CULTURE, BLOOD (ROUTINE X 2)     Status: Normal (Preliminary result)   Collection Time   12/01/10 12:15 PM      Component Value Range Status Comment   Specimen Description BLOOD RIGHT HAND   Final    Special Requests BOTTLES DRAWN AEROBIC AND ANAEROBIC 5CC   Final    Setup Time [id number]   Final    Culture      Final    Value:        BLOOD CULTURE RECEIVED NO GROWTH TO DATE CULTURE WILL BE HELD FOR 5 DAYS BEFORE ISSUING A FINAL NEGATIVE REPORT   Report Status PENDING   Incomplete   CULTURE, BLOOD (ROUTINE X 2)     Status: Normal (Preliminary result)   Collection Time   12/01/10 12:15 PM      Component Value Range Status Comment   Specimen Description BLOOD LEFT HAND   Final    Special Requests BOTTLES DRAWN AEROBIC AND ANAEROBIC 5CC   Final    Setup Time [id number]   Final    Culture     Final    Value:        BLOOD CULTURE RECEIVED NO GROWTH TO DATE CULTURE WILL BE HELD FOR 5 DAYS BEFORE ISSUING A FINAL NEGATIVE REPORT   Report Status PENDING   Incomplete   URINE CULTURE     Status: Normal (Preliminary result)   Collection Time   12/01/10 12:36 PM      Component Value Range Status Comment   Specimen Description URINE, RANDOM   Final    Special Requests NONE   Final    Setup Time [id number]   Final    Colony Count >=100,000 COLONIES/ML   Final    Culture ESCHERICHIA COLI   Final    Report Status PENDING   Incomplete   MRSA PCR SCREENING     Status: Abnormal   Collection Time   12/01/10  6:25 PM      Component Value Range Status Comment   MRSA by PCR POSITIVE (*) NEGATIVE  Final     Studies/Results: Dg Chest Port 1 View  12/01/2010  *RADIOLOGY REPORT*  Clinical Data: Fever, weakness, shortness of breath  PORTABLE CHEST - 1 VIEW  Comparison: Portable exam 1355 hours compared to 09/06/2010  Findings: Rotated to the left. Mild enlargement of cardiac silhouette. Calcified tortuous aorta. Diffuse interstitial infiltrates bilaterally, could represent chronic interstitial lung disease or recurrent edema or infection. No pleural effusion or pneumothorax. Bones appear demineralized.  IMPRESSION: Enlargement of cardiac silhouette with slight pulmonary vascular congestion. Chronic interstitial infiltrates which may represent chronic interstitial lung disease, mild recurrent pulmonary edema, or  infection.  Original Report Authenticated By: Lollie Marrow, M.D.    Medications: I have reviewed the patient's current medications. Scheduled Meds:   . Chlorhexidine Gluconate Cloth  6 each Topical Q0600  . citalopram  20 mg Oral QHS  . enoxaparin (LOVENOX) injection  50 mg Subcutaneous Q24H  . famotidine  40 mg Oral Daily  . glipiZIDE  10 mg Oral BID AC  . hydrochlorothiazide  25 mg Oral Daily  . insulin aspart  0-9 Units Subcutaneous TID WC  . insulin glargine  25 Units Subcutaneous QHS  . mupirocin ointment  1 application Nasal BID  . piperacillin-tazobactam (  ZOSYN)  IV  3.375 g Intravenous Q8H  . potassium chloride  40 mEq Oral BID  . simvastatin  20 mg Oral QHS  . DISCONTD: insulin glargine  20 Units Subcutaneous QHS   Continuous Infusions:   . DISCONTD: sodium chloride 125 mL/hr at 12/02/10 2333   PRN Meds:.acetaminophen, acetaminophen, alum & mag hydroxide-simeth, ondansetron (ZOFRAN) IV, ondansetron, senna Assessment/Plan: Patient Active Hospital Problem List: E-coli UTI (12/03/2010)   Assessment: Patient's urine culture shows Escherichia coli however sensitivities are not reported at this time. We'll continue her on empiric antibiotics until sensitivities been reported.     UTI (lower urinary tract infection) (12/01/2010)   Assessment: See above.    Sepsis (12/01/2010)   Assessment: Sepsis is resolved and the patient is improving clinically on daily basis. I do not feel there is any need for further her IV fluids and thus I will discontinue them at this point    Hypokalemia (12/01/2010)   Assessment: Repleted orally. Labs from today pending to assess efficacy of treatment.     Acute encephalopathy (12/01/2010)   Assessment: Encephalopathy completely resolved.    Hypoxia (12/01/2010)   Assessment: Patient still has some hypoxemia. If she is unable to wean off oxygen today than he may need to repeat a chest x-ray to ensure that she's not having some fluid overload.  The patient does have a history somewhere of obstructive sleep apnea which he denies but she has not had an oxygen requirement prior to this hospitalization.   Diabetes mellitus (12/01/2010)   Assessment: Blood sugars marginally elevated I will increase her Lantus to 25 units subcutaneous at bedtime.    COPD (chronic obstructive pulmonary disease) (12/01/2010)   Assessment: Patient does have a history of COPD which may be the reason for her oxygen requirement. However she has no increased work of breathing she has no wheezing or anything to suggest an exacerbation of her COPD at this time. We'll continue to monitor.   Continue DVT prophylaxis with Lovenox. Expected the patient will be able to transfer back to her assisted living facility by tomorrow once her sensitivities are back regarding her urinary tract infection.       LOS: 2 days

## 2010-12-03 NOTE — Progress Notes (Signed)
UR CHART REVIEWED 

## 2010-12-04 LAB — DIFFERENTIAL
Basophils Relative: 0 % (ref 0–1)
Eosinophils Absolute: 0.3 10*3/uL (ref 0.0–0.7)
Eosinophils Relative: 2 % (ref 0–5)
Lymphs Abs: 2 10*3/uL (ref 0.7–4.0)
Monocytes Relative: 4 % (ref 3–12)

## 2010-12-04 LAB — CBC
Hemoglobin: 11.1 g/dL — ABNORMAL LOW (ref 12.0–15.0)
MCH: 24.6 pg — ABNORMAL LOW (ref 26.0–34.0)
MCHC: 30.5 g/dL (ref 30.0–36.0)
MCV: 80.5 fL (ref 78.0–100.0)
RBC: 4.52 MIL/uL (ref 3.87–5.11)

## 2010-12-04 LAB — BASIC METABOLIC PANEL
BUN: 11 mg/dL (ref 6–23)
Creatinine, Ser: 0.41 mg/dL — ABNORMAL LOW (ref 0.50–1.10)
GFR calc Af Amer: >90 mL/min (ref >90)
GFR calc non Af Amer: >90 mL/min (ref >90)
Glucose, Bld: 187 mg/dL — ABNORMAL HIGH (ref 70–99)
Potassium: 3.3 mEq/L — ABNORMAL LOW (ref 3.5–5.1)

## 2010-12-04 MED ORDER — FUROSEMIDE 10 MG/ML IJ SOLN: 40.0000 mg | Freq: Once | INTRAMUSCULAR | Status: DC

## 2010-12-04 MED ORDER — DEXTROSE 5 % IV SOLN
1.0000 g | INTRAVENOUS | Status: DC
  Administered 2010-12-04 – 2010-12-07 (×4): 1 g via INTRAVENOUS
  Filled 2010-12-04 (×5): qty 10

## 2010-12-04 MED ORDER — FUROSEMIDE 10 MG/ML IJ SOLN
40.0000 mg | Freq: Once | INTRAMUSCULAR | Status: AC
  Administered 2010-12-04: 40 mg via INTRAVENOUS
  Filled 2010-12-04: qty 4

## 2010-12-04 NOTE — Progress Notes (Signed)
Tried to wean down oxygen, patient was 81% on room air after 30 minutes after removing oxygen. O2 at 2 liters reapplied.

## 2010-12-05 LAB — GLUCOSE, CAPILLARY
Glucose-Capillary: 200 mg/dL — ABNORMAL HIGH (ref 70–99)
Glucose-Capillary: 254 mg/dL — ABNORMAL HIGH (ref 70–99)
Glucose-Capillary: 219 mg/dL — ABNORMAL HIGH (ref 70–99)
Glucose-Capillary: 193 mg/dL — ABNORMAL HIGH (ref 70–99)

## 2010-12-05 MED ORDER — INSULIN GLARGINE 100 UNIT/ML ~~LOC~~ SOLN
28.0000 [IU] | Freq: Every day | SUBCUTANEOUS | Status: DC
  Administered 2010-12-05: 28 [IU] via SUBCUTANEOUS

## 2010-12-05 NOTE — Progress Notes (Signed)
Clinical Social Worker received consult that patient is from a facility. Patient is from ALF: [facility], has been there for past 3-4 months per daughter and self report.  Very happy with care and wanting to return if at all possible.  Per Physical Therapy evaluation, patient is being recommended for SNF due to decreased strength, decreased mobility, deceased activity tolerance and will benefit from skilled PT in the acute care setting to address deficits and facilitate return to facility with PT following on D/C.  CSW discussed with daughter and patient.  Would like [facility] to assess first (on Monday) to see if patient can return.  If not the family is agreeable to SNF.  Patient first choice is [facility], if bed available.    Full assessment in shadow chart with FL2 to follow.  Will leave message for ALF to assess patient.   Ashley Jacobs, MSW LCSWA [phone]

## 2010-12-05 NOTE — Progress Notes (Signed)
Subjective: After speaking with her daughter yesterday patient has now agreed to go to SNF. Pt states tha she feels much better today and is able to remember events better.  Interval history: Patient has been tolerating her diet since yesterday she's had no further nausea. The patient has had several bowel movements but they have been formed. She's had no abdominal pain no difficulty with breathing. Objective: Filed Vitals:   12/04/10 1501 12/04/10 2009 12/05/10 0511 12/05/10 1348  BP: 153/66 150/71 159/78 173/73  Pulse: 71 78 69 91  Temp: 97.8 F (36.6 C) 97.5 F (36.4 C) 97.8 F (36.6 C) 98.2 F (36.8 C)  TempSrc: Oral Oral Oral Oral  Resp: 16 19 18 18   Height:      Weight:      SpO2: 95% 90% 95% 75%   Weight change:   Intake/Output Summary (Last 24 hours) at 12/05/10 1808 Last data filed at 12/05/10 1300  Gross per 24 hour  Intake      0 ml  Output   2100 ml  Net  -2100 ml    General: Alert, awake, oriented x3, in no acute distress. Nontoxic-appearing HEENT: [place]/AT PEERL, EOMI Neck: Trachea midline,  no masses, no thyromegal,y no JVD, no carotid bruit OROPHARYNX:  Moist, No exudate/ erythema/lesions.  Heart: Regular rate and rhythm, without murmurs, rubs, gallops, PMI non-displaced, no heaves or thrills on palpation.  Lungs: Clear to auscultation, no wheezing or rhonchi noted. No increased vocal fremitus resonant to percussion  Abdomen: distended, positive bowel sounds, no masses no hepatosplenomegaly noted. Ms. Storck states that this is the baseline appearance of her abdomen. And in fact her abdomen has been this way since admission.  Neuro: No focal neurological deficits noted cranial nerves II through XII grossly intact. DTRs 2+ bilaterally upper and lower extremities. Strength 5 out of 5 in bilateral upper and lower extremities. Musculoskeletal: No warm swelling or erythema around joints, no spinal tenderness noted. Psychiatric: Patient alert and oriented x3, good  insight and cognition, good recent to remote recall. Lymph node survey: No cervical axillary or inguinal lymphadenopathy noted.     Lab Results:  [facility] 12/04/10 1205  NA 134*  K 3.3*  CL 97  CO2 26  GLUCOSE 187*  BUN 11  CREATININE 0.41*  CALCIUM 9.4  MG 1.8  PHOS --   No results found for this basename: AST:2,ALT:2,ALKPHOS:2,BILITOT:2,PROT:2,ALBUMIN:2 in the last 72 hours No results found for this basename: LIPASE:2,AMYLASE:2 in the last 72 hours  Basename 12/04/10 1205  WBC 10.9*  NEUTROABS 8.2*  HGB 11.1*  HCT 36.4  MCV 80.5  PLT 309   No results found for this basename: CKTOTAL:3,CKMB:3,CKMBINDEX:3,TROPONINI:3 in the last 72 hours No results found for this basename: POCBNP:3 in the last 72 hours No results found for this basename: DDIMER:2 in the last 72 hours No results found for this basename: HGBA1C:2 in the last 72 hours No results found for this basename: CHOL:2,HDL:2,LDLCALC:2,TRIG:2,CHOLHDL:2,LDLDIRECT:2 in the last 72 hours No results found for this basename: TSH,T4TOTAL,FREET3,T3FREE,THYROIDAB in the last 72 hours No results found for this basename: VITAMINB12:2,FOLATE:2,FERRITIN:2,TIBC:2,IRON:2,RETICCTPCT:2 in the last 72 hours  Micro Results: Recent Results (from the past 240 hour(s))  CULTURE, BLOOD (ROUTINE X 2)     Status: Normal (Preliminary result)   Collection Time   12/01/10 12:15 PM      Component Value Range Status Comment   Specimen Description BLOOD RIGHT HAND   Final    Special Requests BOTTLES DRAWN AEROBIC AND ANAEROBIC 5CC  Final    Setup Time [id number]   Final    Culture     Final    Value:        BLOOD CULTURE RECEIVED NO GROWTH TO DATE CULTURE WILL BE HELD FOR 5 DAYS BEFORE ISSUING A FINAL NEGATIVE REPORT   Report Status PENDING   Incomplete   CULTURE, BLOOD (ROUTINE X 2)     Status: Normal (Preliminary result)   Collection Time   12/01/10 12:15 PM      Component Value Range Status Comment   Specimen Description BLOOD LEFT  HAND   Final    Special Requests BOTTLES DRAWN AEROBIC AND ANAEROBIC 5CC   Final    Setup Time [id number]   Final    Culture     Final    Value:        BLOOD CULTURE RECEIVED NO GROWTH TO DATE CULTURE WILL BE HELD FOR 5 DAYS BEFORE ISSUING A FINAL NEGATIVE REPORT   Report Status PENDING   Incomplete   URINE CULTURE     Status: Normal   Collection Time   12/01/10 12:36 PM      Component Value Range Status Comment   Specimen Description URINE, RANDOM   Final    Special Requests NONE   Final    Setup Time [id number]   Final    Colony Count >=100,000 COLONIES/ML   Final    Culture ESCHERICHIA COLI   Final    Report Status 12/03/2010 FINAL   Final    Organism ID, Bacteria ESCHERICHIA COLI   Final   MRSA PCR SCREENING     Status: Abnormal   Collection Time   12/01/10  6:25 PM      Component Value Range Status Comment   MRSA by PCR POSITIVE (*) NEGATIVE  Final     Studies/Results: Dg Chest Port 1 View  12/01/2010  *RADIOLOGY REPORT*  Clinical Data: Fever, weakness, shortness of breath  PORTABLE CHEST - 1 VIEW  Comparison: Portable exam 1355 hours compared to 09/06/2010  Findings: Rotated to the left. Mild enlargement of cardiac silhouette. Calcified tortuous aorta. Diffuse interstitial infiltrates bilaterally, could represent chronic interstitial lung disease or recurrent edema or infection. No pleural effusion or pneumothorax. Bones appear demineralized.  IMPRESSION: Enlargement of cardiac silhouette with slight pulmonary vascular congestion. Chronic interstitial infiltrates which may represent chronic interstitial lung disease, mild recurrent pulmonary edema, or infection.  Original Report Authenticated By: Lollie Marrow, M.D.    Medications: I have reviewed the patient's current medications. Scheduled Meds:    . cefTRIAXone (ROCEPHIN) IV  1 g Intravenous Q24H  . Chlorhexidine Gluconate Cloth  6 each Topical Q0600  . citalopram  20 mg Oral QHS  . enoxaparin (LOVENOX) injection  50  mg Subcutaneous Q24H  . famotidine  40 mg Oral Daily  . glipiZIDE  10 mg Oral BID AC  . hydrochlorothiazide  25 mg Oral Daily  . insulin aspart  0-9 Units Subcutaneous TID WC  . insulin glargine  25 Units Subcutaneous QHS  . mupirocin ointment  1 application Nasal BID  . potassium chloride  40 mEq Oral BID  . simvastatin  20 mg Oral QHS   Continuous Infusions:  PRN Meds:.acetaminophen, acetaminophen, alum & mag hydroxide-simeth, ondansetron (ZOFRAN) IV, ondansetron, senna Assessment/Plan: Patient Active Hospital Problem List: E-coli UTI (12/03/2010)   Assessment: Patient's urine culture shows Escherichia coli. Pt now on Day# 2 of appropriate antibiotics with Rocephin. UTI (lower urinary tract infection) (12/01/2010)   Assessment:  See above.    Sepsis (12/01/2010)   Assessment: Sepsis is resolved and the patient is improving clinically on daily basis. I do not feel there is any need for further her IV fluids and thus I will discontinue them at this point    Hypokalemia (12/01/2010)   Assessment: Repleted orally. Labs from today pending to assess efficacy of treatment.     Acute encephalopathy (12/01/2010)   Assessment: Encephalopathy completely resolved.    Hypoxia (12/01/2010)   Assessment: Patient still has some hypoxemia. Her chest x-ray does show some fluid overload.Will continue gentle diuresis   The patient does have a history somewhere of obstructive sleep apnea which he denies but she has not had an oxygen requirement prior to this hospitalization.   Diabetes mellitus (12/01/2010)   Assessment: Blood sugars marginally elevated I will increase her Lantus to 25 units subcutaneous at bedtime.    COPD (chronic obstructive pulmonary disease) (12/01/2010)   Assessment: Patient does have a history of COPD which may be the reason for her oxygen requirement. However she has no increased work of breathing she has no wheezing or anything to suggest an exacerbation of her COPD at this  time. We'll continue to monitor.   Continue DVT prophylaxis with Lovenox.  Disposition: Expected evaluation by ALF to see if the patient will be able to transfer back to her assisted living facility.  If not she is willing to go to SNF for short term rehab.       LOS: 4 days

## 2010-12-06 LAB — CBC
MCV: 82.5 fL (ref 78.0–100.0)
Platelets: 342 10*3/uL (ref 150–400)
RBC: 4.75 MIL/uL (ref 3.87–5.11)
RDW: 19.5 % — ABNORMAL HIGH (ref 11.5–15.5)
WBC: 10.1 10*3/uL (ref 4.0–10.5)

## 2010-12-06 LAB — DIFFERENTIAL
Basophils Absolute: 0 10*3/uL (ref 0.0–0.1)
Eosinophils Relative: 3 % (ref 0–5)
Lymphocytes Relative: 21 % (ref 12–46)
Neutro Abs: 7.2 10*3/uL (ref 1.7–7.7)
Neutrophils Relative %: 71 % (ref 43–77)

## 2010-12-06 LAB — BASIC METABOLIC PANEL
Calcium: 9.7 mg/dL (ref 8.4–10.5)
Creatinine, Ser: 0.48 mg/dL — ABNORMAL LOW (ref 0.50–1.10)
GFR calc Af Amer: >90 mL/min (ref >90)
GFR calc non Af Amer: >90 mL/min (ref >90)
Sodium: 140 mEq/L (ref 135–145)

## 2010-12-06 LAB — GLUCOSE, CAPILLARY: Glucose-Capillary: 249 mg/dL — ABNORMAL HIGH (ref 70–99)

## 2010-12-06 MED ORDER — ALBUTEROL SULFATE (5 MG/ML) 0.5% IN NEBU: 2.5000 mg | INHALATION_SOLUTION | Freq: Four times a day (QID) | RESPIRATORY_TRACT | Status: DC

## 2010-12-06 MED ORDER — IPRATROPIUM BROMIDE 0.02 % IN SOLN: 0.5000 mg | Freq: Four times a day (QID) | RESPIRATORY_TRACT | Status: DC

## 2010-12-06 MED ORDER — AZITHROMYCIN 500 MG PO TABS
500.0000 mg | ORAL_TABLET | Freq: Every day | ORAL | Status: AC
  Administered 2010-12-06: 500 mg via ORAL
  Filled 2010-12-06: qty 1

## 2010-12-06 MED ORDER — INSULIN GLARGINE 100 UNIT/ML ~~LOC~~ SOLN
33.0000 [IU] | Freq: Every day | SUBCUTANEOUS | Status: DC
  Administered 2010-12-06 – 2010-12-08 (×2): 33 [IU] via SUBCUTANEOUS

## 2010-12-06 MED ORDER — AZITHROMYCIN 250 MG PO TABS
250.0000 mg | ORAL_TABLET | Freq: Every day | ORAL | Status: DC
  Administered 2010-12-07: 250 mg via ORAL
  Filled 2010-12-06 (×2): qty 1

## 2010-12-06 NOTE — Progress Notes (Signed)
Interval History: Denise Lee is a 70 year old female who was admitted on 12/01/10 with sepsis from E. Coli UTI.  She was initially treated with zosyn, but her antibiotics were narrowed to rocephin on 12/04/10.  She has clinically improved and it appears that her ALF has accepted her back to their facility when she is medically stable. ROS: Denise Lee says she feels short of breath.  She also has a productive cough, but has not looked at the sputum to tell me what color it is.     Objective: Vital signs in last 24 hours: Temp:  [97.4 F (36.3 C)-98.5 F (36.9 C)] 98 F (36.7 C) (11/26 1342) Pulse Rate:  [66-83] 71  (11/26 1342) Resp:  [17-20] 20  (11/26 1342) BP: (132-167)/(65-76) 132/65 mmHg (11/26 1342) SpO2:  [90 %-92 %] 91 % (11/26 1342) Weight change:  Last BM Date: 12/03/10  Intake/Output from previous day:  Intake/Output Summary (Last 24 hours) at 12/06/10 1503 Last data filed at 12/06/10 0436  Gross per 24 hour  Intake    770 ml  Output   1160 ml  Net   -390 ml     Physical Exam:  Gen:  NAD Cardiovascular:  RRR, no M/R/G Respiratory: Faint expiratory wheezes, diminished breath sounds. Gastrointestinal: Abdomen soft, NT/ND with normal active bowel sounds. Extremities: No C/E/C   Lab Results: Basic Metabolic Panel:  Lab 12/06/10 1610 12/04/10 1205 12/02/10 0310 12/01/10 1230  NA 140 134* 134* 135  K 4.0 3.3* -- --  CL 103 97 99 97  CO2 29 26 27 27   GLUCOSE 178* 187* 148* 172*  BUN 12 11 13 16   CREATININE 0.48* 0.41* 0.52 0.53  CALCIUM 9.7 9.4 8.8 9.4  MG -- 1.8 -- --  PHOS -- -- -- --   Liver Function Tests:  Lab 12/01/10 1230  AST 10  ALT 9  ALKPHOS 77  BILITOT 0.4  PROT 7.1  ALBUMIN 3.0*  CBC:  Lab 12/06/10 0347 12/04/10 1205 12/02/10 0310 12/01/10 1230  WBC 10.1 10.9* 13.7* 18.4*  NEUTROABS 7.2 8.2* -- 16.2*  HGB 11.5* 11.1* 10.4* 11.1*  HCT 39.2 36.4 34.0* 35.8*  MCV 82.5 80.5 80.8 80.1  PLT 342 309 219 248   CBG:  Lab 12/06/10 1150  12/06/10 0756 12/05/10 2150 12/05/10 1642 12/05/10 1149  GLUCAP 221* 208* 193* 219* 254*    Recent Results (from the past 240 hour(s))  CULTURE, BLOOD (ROUTINE X 2)     Status: Normal (Preliminary result)   Collection Time   12/01/10 12:15 PM      Component Value Range Status Comment   Specimen Description BLOOD RIGHT HAND   Final    Special Requests BOTTLES DRAWN AEROBIC AND ANAEROBIC 5CC   Final    Setup Time [id number]   Final    Culture     Final    Value:        BLOOD CULTURE RECEIVED NO GROWTH TO DATE CULTURE WILL BE HELD FOR 5 DAYS BEFORE ISSUING A FINAL NEGATIVE REPORT   Report Status PENDING   Incomplete   CULTURE, BLOOD (ROUTINE X 2)     Status: Normal (Preliminary result)   Collection Time   12/01/10 12:15 PM      Component Value Range Status Comment   Specimen Description BLOOD LEFT HAND   Final    Special Requests BOTTLES DRAWN AEROBIC AND ANAEROBIC 5CC   Final    Setup Time [id number]   Final  Culture     Final    Value:        BLOOD CULTURE RECEIVED NO GROWTH TO DATE CULTURE WILL BE HELD FOR 5 DAYS BEFORE ISSUING A FINAL NEGATIVE REPORT   Report Status PENDING   Incomplete   URINE CULTURE     Status: Normal   Collection Time   12/01/10 12:36 PM      Component Value Range Status Comment   Specimen Description URINE, RANDOM   Final    Special Requests NONE   Final    Setup Time [id number]   Final    Colony Count >=100,000 COLONIES/ML   Final    Culture ESCHERICHIA COLI   Final    Report Status 12/03/2010 FINAL   Final    Organism ID, Bacteria ESCHERICHIA COLI   Final   MRSA PCR SCREENING     Status: Abnormal   Collection Time   12/01/10  6:25 PM      Component Value Range Status Comment   MRSA by PCR POSITIVE (*) NEGATIVE  Final     Studies/Results: No results found.  Medications: Scheduled Meds:   . cefTRIAXone (ROCEPHIN) IV  1 g Intravenous Q24H  . Chlorhexidine Gluconate Cloth  6 each Topical Q0600  . citalopram  20 mg Oral QHS  . enoxaparin  (LOVENOX) injection  50 mg Subcutaneous Q24H  . famotidine  40 mg Oral Daily  . glipiZIDE  10 mg Oral BID AC  . hydrochlorothiazide  25 mg Oral Daily  . insulin aspart  0-9 Units Subcutaneous TID WC  . insulin glargine  28 Units Subcutaneous QHS  . mupirocin ointment  1 application Nasal BID  . potassium chloride  40 mEq Oral BID  . simvastatin  20 mg Oral QHS  . DISCONTD: insulin glargine  25 Units Subcutaneous QHS   Continuous Infusions:  PRN Meds:.acetaminophen, acetaminophen, alum & mag hydroxide-simeth, ondansetron (ZOFRAN) IV, ondansetron, senna  Assessment/Plan:  Principal Problem:  *E-coli UTI / Sepsis Day # 6 of antibiotics, currently rocephin.  Blood cultures negative to date.  ? Superimposed bronchitis given her respiratory complaints.  Will add azithromycin and check a sputum culture.  Start bronchodilator treatment. Active Problems:  Acute encephalopathy Resolved.  Hypoxia/dyspnea ? Superimposed bronchitis given her respiratory complaints.  Will add azithromycin and check a sputum culture.  Start bronchodilator treatment.   Diabetes mellitus CBGs 193-254.  Increase lantus.  COPD (chronic obstructive pulmonary disease) ? Superimposed bronchitis given her respiratory complaints.  Will add azithromycin and check a sputum culture.  Start bronchodilator treatment.  Hypokalemia Resolved.   LOS: 5 days   Hillery Aldo, MD Pager [phone]  12/06/2010, 3:03 PM

## 2010-12-06 NOTE — Progress Notes (Signed)
CSW spoke w/[facility] and requested they assess if patient can come back. Someone will contact Child psychotherapist.  Jalila Goodnough C. Emiyah Spraggins MSW, LCSW [phone]

## 2010-12-06 NOTE — Progress Notes (Signed)
Spoke with [facility] Rep, Britta Mccreedy, and they are prepared to accept her back to ALF. The patient primarily uses a motorized wheelchair in their setting. Will need signed FL2, RX for any new or changed meds and d/c summary. Advised ALF of possible d/c tomorrow. Denise Lee, MSW, Theresia Majors [phone]

## 2010-12-06 NOTE — Progress Notes (Signed)
Physical Therapy Treatment Patient Details Name: Denise Lee MRN: [id number] DOB: Jul 23, 1940 Today's Date: 12/06/2010 Time: 4098-1191 Charge: TA, TE PT Assessment/Plan  PT - Assessment/Plan Comments on Treatment Session: Pt able to transfer to recliner today with less assist needed compared to previous visit. PT Plan: Discharge plan remains appropriate;Frequency remains appropriate Follow Up Recommendations: Skilled nursing facility (from [facility] (SNF vs ALF)) PT Goals  Acute Rehab PT Goals Pt will Roll Supine to Right Side: Independently PT Goal: Rolling Supine to Right Side - Progress: Revised (modified due to lack of progress/goal met) Pt will go Supine/Side to Sit: with modified independence;with HOB 0 degrees;with rail PT Goal: Supine/Side to Sit - Progress: Revised (modified due to lack of progress/goal met) PT Transfer Goal: Sit to Stand/Stand to Sit - Progress: Progressing toward goal PT Transfer Goal: Bed to Chair/Chair to Bed - Progress: Progressing toward goal  PT Treatment Precautions/Restrictions  Precautions Precautions: Fall  Mobility (including Balance) Bed Mobility Bed Mobility: Yes Rolling Right: 5: Supervision Rolling Right Details (indicate cue type and reason): increased time, verbal cue for rail to assist Rolling Left: 5: Supervision Rolling Left Details (indicate cue type and reason): increased time Left Sidelying to Sit: With rails;HOB elevated (comment degrees);4: Min assist Left Sidelying to Sit Details (indicate cue type and reason): assist for trunk Transfers Transfers: Yes Sit to Stand: 4: Min assist;With upper extremity assist;With armrests;From bed Sit to Stand Details (indicate cue type and reason): verbal cues for hand placement, pt declined use of RW with transfer, so used recliner armrests to assist Stand to Sit: To chair/3-in-1;With upper extremity assist;4: Min assist Stand to Sit Details: verbal cues for hand placement, assist  to control descent Stand Pivot Transfers: 4: Min assist Stand Pivot Transfer Details (indicate cue type and reason): min assist for safety with transfer    Exercise  General Exercises - Lower Extremity Ankle Circles/Pumps: AROM;Both;20 reps;Seated (exercises performed in recliner) Quad Sets: AROM;Strengthening;Both;20 reps;Seated Long Arc Quad: AROM;Strengthening;Both;20 reps;Seated Heel Slides: Strengthening;AROM;Both;20 reps;Seated Hip ABduction/ADduction: AROM;Strengthening;Both;20 reps;Seated End of Session PT - End of Session Activity Tolerance: Patient tolerated treatment well Patient left: in chair;with call bell in reach General Behavior During Session: [facility] for tasks performed Cognition: [facility] for tasks performed  Andreika Vandagriff,KATHrine E 12/06/2010, 3:06 PM Pager: [phone]

## 2010-12-06 NOTE — Progress Notes (Signed)
Pt. Sleeping a lot today,states she hasn't been sleeping well at night. This afternoon she started acting alittle confused. She was trying to call her daughter and was mad at her because she didn't pick. The daughter said she was dialing a number they had 30 years ago. Then she asked me to pick up some papers next to the bed that had fallen. I went to do that but there were no papers, she then got mad that I didn't know what the papers were for. She said I was with the man last night that put them together, she couldn't remember what the papers were for. When I asked questions she answered appropriately. The daughter said that this happened the last time she was treated for a UTI.

## 2010-12-07 DIAGNOSIS — G629 Polyneuropathy, unspecified: Secondary | ICD-10-CM | POA: Diagnosis present

## 2010-12-07 LAB — CBC
HCT: 37.3 % (ref 36.0–46.0)
MCV: 81.8 fL (ref 78.0–100.0)
RBC: 4.56 MIL/uL (ref 3.87–5.11)
RDW: 19.7 % — ABNORMAL HIGH (ref 11.5–15.5)
WBC: 11.7 10*3/uL — ABNORMAL HIGH (ref 4.0–10.5)

## 2010-12-07 LAB — GLUCOSE, CAPILLARY

## 2010-12-07 LAB — BASIC METABOLIC PANEL
CO2: 28 mEq/L (ref 19–32)
Chloride: 102 mEq/L (ref 96–112)
Creatinine, Ser: 0.43 mg/dL — ABNORMAL LOW (ref 0.50–1.10)
Glucose, Bld: 177 mg/dL — ABNORMAL HIGH (ref 70–99)

## 2010-12-07 LAB — CULTURE, BLOOD (ROUTINE X 2)
Culture: NO GROWTH
Culture: NO GROWTH

## 2010-12-07 MED ORDER — PREGABALIN 50 MG PO CAPS
50.0000 mg | ORAL_CAPSULE | Freq: Three times a day (TID) | ORAL | Status: DC
  Administered 2010-12-07 (×2): 50 mg via ORAL
  Filled 2010-12-07 (×2): qty 1

## 2010-12-07 MED ORDER — PREGABALIN 50 MG PO CAPS: 50.0000 mg | ORAL_CAPSULE | Freq: Three times a day (TID) | ORAL | Status: DC

## 2010-12-07 MED ORDER — ALBUTEROL SULFATE (5 MG/ML) 0.5% IN NEBU: 2.5000 mg | INHALATION_SOLUTION | Freq: Four times a day (QID) | RESPIRATORY_TRACT | Status: DC | PRN

## 2010-12-07 NOTE — Progress Notes (Signed)
Placed call to [facility] Non-emergent line [phone]. Dispatcher to check on progress of transport.

## 2010-12-07 NOTE — Progress Notes (Signed)
Patient for d/c today back to [facility]- patient and family agreeable- plan transfer via EMS. Reece Levy, MSW, Theresia Majors [phone]

## 2010-12-07 NOTE — Progress Notes (Signed)
Spoke w/Tara at [facility]. States she does not have copy of pts DC summary for Med list & the Pharmacy at the facility is closed. She spoke w/the facility DON & facility will be unable to take pt. Back at this late time. Called [facility] & cancelled transport for this evening. Daughter Jearld Adjutant called & made aware that pt. Will stay here at [facility] tonight & transport tomorrow.

## 2010-12-07 NOTE — Progress Notes (Signed)
Arrived to patient's bedside, pt became upset when I stated that I was there to give her a breathing treatment, pt said that she does not take them at home and does not want them scheduled here.  Pt requested that she be given txs only when she calls for them when needed.  Plan is to change hhns to q6prn for sob or wheeze per patient request.

## 2010-12-07 NOTE — Discharge Summary (Addendum)
Physician Discharge Summary  Patient ID: Denise Lee MRN: [id number] DOB/AGE: [birth year] 70 y.o.  Admit date: 12/01/2010 Discharge date: 12/07/2010  Primary Care Physician:  Florentina Jenny, MD   Discharge Diagnoses:    Present on Admission:  .E-coli UTI  Sepsis  Acute encephalopathy  Hypoxia  Diabetes mellitus  COPD (chronic obstructive pulmonary disease)  Hypokalemia    Discharge Medications:  Current Discharge Medication List    START taking these medications   Details  pregabalin (LYRICA) 50 MG capsule Take 1 capsule (50 mg total) by mouth 3 (three) times daily.      CONTINUE these medications which have NOT CHANGED   Details  cholecalciferol (VITAMIN D) 1000 UNITS tablet Take 1,000 Units by mouth daily.      citalopram (CELEXA) 20 MG tablet Take 20 mg by mouth at bedtime.      famotidine (PEPCID) 40 MG tablet Take 40 mg by mouth daily.      glipiZIDE (GLUCOTROL) 10 MG tablet Take 10 mg by mouth 2 (two) times daily before a meal.      hydrochlorothiazide (HYDRODIURIL) 25 MG tablet Take 25 mg by mouth daily.      insulin aspart (NOVOLOG) 100 UNIT/ML injection Inject into the skin. Instructions are to give 4 units SQ if fasting blood sugar is >100.    Insulin Glargine (LANTUS SOLOSTAR [place]) Inject 46 Units into the skin 2 (two) times daily.      Lactobacillus (ACIDOPHILUS PO) Take 2 capsules by mouth daily. DOSAGE UNKNOWN     lisinopril (PRINIVIL,ZESTRIL) 5 MG tablet Take 5 mg by mouth daily.      Loratadine 10 MG CAPS Take 10 mg by mouth daily. ALLERGIES     metFORMIN (GLUCOPHAGE) 500 MG tablet Take 1,000 mg by mouth 2 (two) times daily with a meal.      simvastatin (ZOCOR) 20 MG tablet Take 20 mg by mouth at bedtime.           Disposition and Follow-up: The patient is being discharged back to her ALF.  She can follow-up with Dr. Redmond School in one week.  Consults:  None   Significant Diagnostic Studies:  Dg Chest Port 1 View  12/01/2010  *RADIOLOGY  REPORT*  Clinical Data: Fever, weakness, shortness of breath  PORTABLE CHEST - 1 VIEW  Comparison: Portable exam 1355 hours compared to 09/06/2010  Findings: Rotated to the left. Mild enlargement of cardiac silhouette. Calcified tortuous aorta. Diffuse interstitial infiltrates bilaterally, could represent chronic interstitial lung disease or recurrent edema or infection. No pleural effusion or pneumothorax. Bones appear demineralized.  IMPRESSION: Enlargement of cardiac silhouette with slight pulmonary vascular congestion. Chronic interstitial infiltrates which may represent chronic interstitial lung disease, mild recurrent pulmonary edema, or infection.  Original Report Authenticated By: Lollie Marrow, M.D.    Discharge Laboratory Values: Results for orders placed during the hospital encounter of 12/01/10 (from the past 48 hour(s))  GLUCOSE, CAPILLARY     Status: Abnormal   Collection Time   12/05/10 11:49 AM      Component Value Range Comment   Glucose-Capillary 254 (*) 70 - 99 (mg/dL)   GLUCOSE, CAPILLARY     Status: Abnormal   Collection Time   12/05/10  4:42 PM      Component Value Range Comment   Glucose-Capillary 219 (*) 70 - 99 (mg/dL)    Comment 1 Notify RN     GLUCOSE, CAPILLARY     Status: Abnormal   Collection Time   12/05/10  9:50 PM      Component Value Range Comment   Glucose-Capillary 193 (*) 70 - 99 (mg/dL)    Comment 1 Notify RN     BASIC METABOLIC PANEL     Status: Abnormal   Collection Time   12/06/10  3:47 AM      Component Value Range Comment   Sodium 140  135 - 145 (mEq/L) REPEATED TO VERIFY   Potassium 4.0  3.5 - 5.1 (mEq/L)    Chloride 103  96 - 112 (mEq/L)    CO2 29  19 - 32 (mEq/L)    Glucose, Bld 178 (*) 70 - 99 (mg/dL)    BUN 12  6 - 23 (mg/dL)    Creatinine, Ser 1.61 (*) 0.50 - 1.10 (mg/dL)    Calcium 9.7  8.4 - 10.5 (mg/dL)    GFR calc non Af Amer >90  >90 (mL/min)    GFR calc Af Amer >90  >90 (mL/min)   CBC     Status: Abnormal   Collection Time    12/06/10  3:47 AM      Component Value Range Comment   WBC 10.1  4.0 - 10.5 (K/uL)    RBC 4.75  3.87 - 5.11 (MIL/uL)    Hemoglobin 11.5 (*) 12.0 - 15.0 (g/dL)    HCT 09.6  04.5 - 40.9 (%)    MCV 82.5  78.0 - 100.0 (fL)    MCH 24.2 (*) 26.0 - 34.0 (pg)    MCHC 29.3 (*) 30.0 - 36.0 (g/dL)    RDW 81.1 (*) 91.4 - 15.5 (%)    Platelets 342  150 - 400 (K/uL)   DIFFERENTIAL     Status: Normal   Collection Time   12/06/10  3:47 AM      Component Value Range Comment   Neutrophils Relative 71  43 - 77 (%)    Neutro Abs 7.2  1.7 - 7.7 (K/uL)    Lymphocytes Relative 21  12 - 46 (%)    Lymphs Abs 2.1  0.7 - 4.0 (K/uL)    Monocytes Relative 4  3 - 12 (%)    Monocytes Absolute 0.4  0.1 - 1.0 (K/uL)    Eosinophils Relative 3  0 - 5 (%)    Eosinophils Absolute 0.3  0.0 - 0.7 (K/uL)    Basophils Relative 0  0 - 1 (%)    Basophils Absolute 0.0  0.0 - 0.1 (K/uL)   GLUCOSE, CAPILLARY     Status: Abnormal   Collection Time   12/06/10  7:56 AM      Component Value Range Comment   Glucose-Capillary 208 (*) 70 - 99 (mg/dL)    Comment 1 Documented in Chart      Comment 2 Notify RN     GLUCOSE, CAPILLARY     Status: Abnormal   Collection Time   12/06/10 11:50 AM      Component Value Range Comment   Glucose-Capillary 221 (*) 70 - 99 (mg/dL)    Comment 1 Documented in Chart      Comment 2 Notify RN     GLUCOSE, CAPILLARY     Status: Abnormal   Collection Time   12/06/10  5:21 PM      Component Value Range Comment   Glucose-Capillary 193 (*) 70 - 99 (mg/dL)    Comment 1 Notify RN     GLUCOSE, CAPILLARY     Status: Abnormal   Collection Time   12/06/10  9:19 PM  Component Value Range Comment   Glucose-Capillary 249 (*) 70 - 99 (mg/dL)    Comment 1 Notify RN     BASIC METABOLIC PANEL     Status: Abnormal   Collection Time   12/07/10  3:40 AM      Component Value Range Comment   Sodium 138  135 - 145 (mEq/L)    Potassium 3.7  3.5 - 5.1 (mEq/L)    Chloride 102  96 - 112 (mEq/L)    CO2 28   19 - 32 (mEq/L)    Glucose, Bld 177 (*) 70 - 99 (mg/dL)    BUN 11  6 - 23 (mg/dL)    Creatinine, Ser 1.61 (*) 0.50 - 1.10 (mg/dL)    Calcium 9.4  8.4 - 10.5 (mg/dL)    GFR calc non Af Amer >90  >90 (mL/min)    GFR calc Af Amer >90  >90 (mL/min)   CBC     Status: Abnormal   Collection Time   12/07/10  3:40 AM      Component Value Range Comment   WBC 11.7 (*) 4.0 - 10.5 (K/uL)    RBC 4.56  3.87 - 5.11 (MIL/uL)    Hemoglobin 11.2 (*) 12.0 - 15.0 (g/dL)    HCT 09.6  04.5 - 40.9 (%)    MCV 81.8  78.0 - 100.0 (fL)    MCH 24.6 (*) 26.0 - 34.0 (pg)    MCHC 30.0  30.0 - 36.0 (g/dL)    RDW 81.1 (*) 91.4 - 15.5 (%)    Platelets 344  150 - 400 (K/uL)   GLUCOSE, CAPILLARY     Status: Abnormal   Collection Time   12/07/10  8:28 AM      Component Value Range Comment   Glucose-Capillary 188 (*) 70 - 99 (mg/dL)    Comment 1 Documented in Chart      Comment 2 Notify RN       Brief H and P: For complete details please refer to admission H and P, but in brief, Denise Lee is a 70 year old female who was brought to the hospital for evaluation of N/V and fever.  She was confused and mildly hypoxic on initial presentation.  She was admitted with a suspected UTI and possible bronchitis.  Physical Exam at Discharge: BP 172/76  Pulse 67  Temp(Src) 97.7 F (36.5 C) (Oral)  Resp 18  Ht 5\' 2"  (1.575 m)  Wt 100.2 kg (220 lb 14.4 oz)  BMI 40.40 kg/m2  SpO2 94% Physical Exam:  Gen: NAD, mildly confused but re-orients easily. Cardiovascular: RRR, no M/R/G  Respiratory: Faint expiratory wheezes now gone (heard yesterday), diminished breath sounds.  Gastrointestinal: Abdomen soft, NT/ND with normal active bowel sounds.  Extremities: No C/E/C   Hospital Course:   *E-coli UTI / Sepsis  The patient has completed a 7 day course of antibiotics, currently rocephin. Blood cultures have remained negative to date. Active Problems:  Acute encephalopathy  Resolved.  Hypoxia/dyspnea  ? Superimposed  bronchitis given her respiratory complaints. We added azithromycin but her symptoms dramatically improved with a nebulized bronchodilator treatment, so azithromycin will not be continued at discharge Diabetes mellitus  The patient's CBGs were elevated but she was not immediately put on her usual doses of lantus.  She can safely resume her pre-admission regimen at discharge. COPD (chronic obstructive pulmonary disease)  Recommend ongoing assessment and consideration of PRN bronchodilator therapy, if needed. Hypokalemia  Resolved.  Neuropathy We have added lyrica.  This can be titrated up to achieve better symptom contol by the patient's PCP.  Recommendations for hospital follow-up: 1.  Consider bronchodilator therapy for recurrent wheezing, dyspnea or hypoxia. 2.  Evaluate effect of lyrica on neuropathy symptoms.  Time spent on Discharge: 45 minutes.  Signed: Dr. Trula Ore Nickolai Rinks Pager [phone] 12/07/2010, 8:56 AM

## 2010-12-07 NOTE — Progress Notes (Signed)
Text page sent to [facility] Cover[facility] regarding pt  Not going out to facility.

## 2010-12-07 NOTE — Progress Notes (Signed)
Spoke w/[facility] Non-Emergent line. Dispatcher unable to locate truck that has this pt on list for pickup. Will re-dispatch.

## 2010-12-08 LAB — BASIC METABOLIC PANEL
BUN: 10 mg/dL (ref 6–23)
Calcium: 9.6 mg/dL (ref 8.4–10.5)
Creatinine, Ser: 0.49 mg/dL — ABNORMAL LOW (ref 0.50–1.10)
GFR calc Af Amer: >90 mL/min (ref >90)
GFR calc non Af Amer: >90 mL/min (ref >90)

## 2010-12-08 LAB — GLUCOSE, CAPILLARY

## 2010-12-08 NOTE — Progress Notes (Signed)
Patient for d/c today to [facility]- facility agreeable- daughter contacted and aware. Will make plans for transfer via EMS. Reece Levy, MSW, Theresia Majors [phone]

## 2010-12-08 NOTE — Progress Notes (Signed)
Pt is discharging back to ALF [facility] with [facility] for PT. Referral faxed to [facility] and given to [facility] with [facility]. mp

## 2010-12-08 NOTE — Progress Notes (Signed)
Patient was alert and oriented to her current location and to the fact that she would be being discharged back to her nursing facility.  She was transported via stretcher and since she was supposed to have been discharged yesterday, her IV was already removed (12/07/10) with the tip intact.  All other documentation was completed by the Child psychotherapist.

## 2012-02-08 ENCOUNTER — Encounter (HOSPITAL_COMMUNITY): Payer: Self-pay | Admitting: Emergency Medicine

## 2012-02-08 ENCOUNTER — Emergency Department (HOSPITAL_COMMUNITY)
Admission: EM | Admit: 2012-02-08 | Discharge: 2012-02-08 | Disposition: A | Discharge status: Home / Self Care | Payer: Medicare Other | Attending: Emergency Medicine | Admitting: Emergency Medicine

## 2012-02-08 ENCOUNTER — Emergency Department (HOSPITAL_COMMUNITY): Payer: Medicare Other

## 2012-02-08 DIAGNOSIS — IMO0002 Reserved for concepts with insufficient information to code with codable children: Secondary | ICD-10-CM | POA: Insufficient documentation

## 2012-02-08 DIAGNOSIS — W19XXXA Unspecified fall, initial encounter: Secondary | ICD-10-CM

## 2012-02-08 DIAGNOSIS — Y9289 Other specified places as the place of occurrence of the external cause: Secondary | ICD-10-CM | POA: Insufficient documentation

## 2012-02-08 DIAGNOSIS — Z794 Long term (current) use of insulin: Secondary | ICD-10-CM | POA: Insufficient documentation

## 2012-02-08 DIAGNOSIS — Z23 Encounter for immunization: Secondary | ICD-10-CM | POA: Insufficient documentation

## 2012-02-08 DIAGNOSIS — Z8669 Personal history of other diseases of the nervous system and sense organs: Secondary | ICD-10-CM | POA: Insufficient documentation

## 2012-02-08 DIAGNOSIS — Z87891 Personal history of nicotine dependence: Secondary | ICD-10-CM | POA: Insufficient documentation

## 2012-02-08 DIAGNOSIS — S0003XA Contusion of scalp, initial encounter: Secondary | ICD-10-CM | POA: Insufficient documentation

## 2012-02-08 DIAGNOSIS — Y9389 Activity, other specified: Secondary | ICD-10-CM | POA: Insufficient documentation

## 2012-02-08 DIAGNOSIS — E119 Type 2 diabetes mellitus without complications: Secondary | ICD-10-CM | POA: Insufficient documentation

## 2012-02-08 DIAGNOSIS — I10 Essential (primary) hypertension: Secondary | ICD-10-CM | POA: Insufficient documentation

## 2012-02-08 DIAGNOSIS — T148XXA Other injury of unspecified body region, initial encounter: Secondary | ICD-10-CM

## 2012-02-08 DIAGNOSIS — W050XXA Fall from non-moving wheelchair, initial encounter: Secondary | ICD-10-CM | POA: Insufficient documentation

## 2012-02-08 DIAGNOSIS — E785 Hyperlipidemia, unspecified: Secondary | ICD-10-CM | POA: Insufficient documentation

## 2012-02-08 DIAGNOSIS — I8310 Varicose veins of unspecified lower extremity with inflammation: Secondary | ICD-10-CM | POA: Insufficient documentation

## 2012-02-08 DIAGNOSIS — J4489 Other specified chronic obstructive pulmonary disease: Secondary | ICD-10-CM | POA: Insufficient documentation

## 2012-02-08 DIAGNOSIS — S0590XA Unspecified injury of unspecified eye and orbit, initial encounter: Secondary | ICD-10-CM | POA: Insufficient documentation

## 2012-02-08 DIAGNOSIS — Z79899 Other long term (current) drug therapy: Secondary | ICD-10-CM | POA: Insufficient documentation

## 2012-02-08 DIAGNOSIS — J449 Chronic obstructive pulmonary disease, unspecified: Secondary | ICD-10-CM | POA: Insufficient documentation

## 2012-02-08 DIAGNOSIS — Z87448 Personal history of other diseases of urinary system: Secondary | ICD-10-CM | POA: Insufficient documentation

## 2012-02-08 MED ORDER — TETANUS-DIPHTH-ACELL PERTUSSIS 5-2.5-18.5 LF-MCG/0.5 IM SUSP
0.5000 mL | Freq: Once | INTRAMUSCULAR | Status: AC
  Administered 2012-02-08: 0.5 mL via INTRAMUSCULAR
  Filled 2012-02-08: qty 0.5

## 2012-02-08 NOTE — ED Notes (Signed)
Per EMS-pt from [facility], was in motorized wheelchair leaned over to get newspaper and fell on face. Pt c/o of contusion over L eye and scratches on face and palm. No pain, no headache, no deformities. CBG 77.

## 2012-02-08 NOTE — ED Notes (Signed)
[id number]<AV> Expected date:<BR> Expected time:<BR> Means of arrival:<BR> Comments:<BR> fall

## 2012-02-08 NOTE — ED Notes (Signed)
[employer] called

## 2012-02-08 NOTE — ED Provider Notes (Signed)
History     CSN: [id number]  Arrival date & time 02/08/12  4098   First MD Initiated Contact with Patient 02/08/12 0825      Chief Complaint  Patient presents with  . Fall    (Consider location/radiation/quality/duration/timing/severity/associated sxs/prior treatment) HPI Comments: Pt states that she sits in a wheelchair because she has a hard time ambulating:pt was reading the paper in her wheelchair and it feel out of her hands and she reached over to get it and she fell on her face:no loc or dizziness  Patient is a 72 y.o. female presenting with fall. The history is provided by the patient. No language interpreter was used.  Fall The accident occurred 1 to 2 hours ago. She landed on a hard floor. The point of impact was the head. The pain is mild. She was ambulatory at the scene. There was no entrapment after the fall. There was no drug use involved in the accident. There was no alcohol use involved in the accident.    Past Medical History  Diagnosis Date  . COPD (chronic obstructive pulmonary disease)   . Diabetes mellitus   . Hypertension   . Urinary incontinence   . Stasis dermatitis   . Hyperlipidemia   . Obstructive sleep apnea     Patient denies  . Morbid obesity     Past Surgical History  Procedure Date  . Joint replacement     Knee  . Carpal tunnel release     Patient denies    Family History  Problem Relation Age of Onset  . Esophageal cancer Brother   . Cirrhosis Mother     History  Substance Use Topics  . Smoking status: Former Smoker    Types: Cigarettes  . Smokeless tobacco: Never Used  . Alcohol Use: No    OB History    Grav Para Term Preterm Abortions TAB SAB Ect Mult Living                  Review of Systems  Constitutional: Negative.   Respiratory: Negative.   Cardiovascular: Negative.     Allergies  Codeine and Ciprofloxacin  Home Medications   Current Outpatient Rx  Name  Route  Sig  Dispense  Refill  . VITAMIN D 1000  UNITS PO TABS   Oral   Take 1,000 Units by mouth daily.           Marland Kitchen CITALOPRAM HYDROBROMIDE 20 MG PO TABS   Oral   Take 20 mg by mouth at bedtime.           Marland Kitchen FAMOTIDINE 40 MG PO TABS   Oral   Take 40 mg by mouth daily.           Marland Kitchen GLIPIZIDE 10 MG PO TABS   Oral   Take 10 mg by mouth 2 (two) times daily before a meal.           . HYDROCHLOROTHIAZIDE 25 MG PO TABS   Oral   Take 25 mg by mouth daily.           . INSULIN ASPART 100 UNIT/ML [place] SOLN   Subcutaneous   Inject into the skin. SLIDING SCALE         . LANTUS SOLOSTAR [place]   Subcutaneous   Inject 46 Units into the skin 2 (two) times daily.           . ACIDOPHILUS PO   Oral   Take 2 capsules by mouth  daily. DOSAGE UNKNOWN          . LISINOPRIL 5 MG PO TABS   Oral   Take 5 mg by mouth daily.           Marland Kitchen LORATADINE 10 MG PO CAPS   Oral   Take 10 mg by mouth daily. ALLERGIES          . METFORMIN HCL 500 MG PO TABS   Oral   Take 1,000 mg by mouth 2 (two) times daily with a meal.           . PREGABALIN 50 MG PO CAPS   Oral   Take 1 capsule (50 mg total) by mouth 3 (three) times daily.   90 capsule   3   . SIMVASTATIN 20 MG PO TABS   Oral   Take 20 mg by mouth at bedtime.             BP 138/50  Pulse 70  Temp 97.9 F (36.6 C) (Oral)  Resp 20  SpO2 93%  Physical Exam  Nursing note and vitals reviewed. Constitutional: She is oriented to person, place, and time. She appears well-developed and well-nourished.  HENT:       Hematoma to the left forehead:abrasions around the left eye and cheek  Eyes: Conjunctivae normal and EOM are normal. Pupils are equal, round, and reactive to light.  Neck: Neck supple.  Cardiovascular: Normal rate and regular rhythm.   Pulmonary/Chest: Effort normal and breath sounds normal.  Abdominal: Soft.  Musculoskeletal: Normal range of motion.       Cervical back: Normal.       Thoracic back: Normal.       Lumbar back: Normal.       Pt moves all  extremities without any problem:no shortening or rotation of the legs  Neurological: She is alert and oriented to person, place, and time. Coordination normal.  Skin:       Abrasions to the the left hand and palm    ED Course  Procedures (including critical care time)  Labs Reviewed - No data to display Ct Head Wo Contrast  02/08/2012  *RADIOLOGY REPORT*  Clinical Data:  History of trauma from a fall.  CT HEAD WITHOUT CONTRAST CT MAXILLOFACIAL WITHOUT CONTRAST  Technique:  Multidetector CT imaging of the head and maxillofacial structures were performed using the standard protocol without intravenous contrast. Multiplanar CT image reconstructions of the maxillofacial structures were also generated.  Comparison:  Head CT 09/03/2010.  CT HEAD  Findings: There is a small amount of swelling in the left frontal scalp.  Moderate cerebral and cerebellar atrophy.  Extensive patchy and confluent areas of decreased attenuation throughout the deep and periventricular white matter of the cerebral hemispheres bilaterally, compatible with chronic microvascular ischemic disease. 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 No acute displaced skull fractures are identified.  No acute intracranial abnormality.  Specifically, no evidence of acute post-traumatic intracranial hemorrhage, no definite regions of acute/subacute cerebral ischemia, no focal mass, mass effect, hydrocephalus or abnormal intra or extra-axial fluid collections. Mastoids are well pneumatized bilaterally.  There are patchy areas of mild mucosal thickening in the ethmoid sinuses and maxillary sinuses bilaterally, as well as a small fluid collection layering dependently in the left maxillary sinus (this is intermediate attenuation favored to represent inspissated secretions).  IMPRESSION: 1.  Small amount of soft tissue swelling in the left frontal scalp without  evidence of underlying displaced skull fracture or acute intracranial abnormalities. 2.  Moderate cerebral and cerebellar atrophy with extensive chronic microvascular ischemic changes in the brain, as above. 3.  Mild paranasal sinus disease, including a small air-fluid level in the left maxillary sinus.  Clinical correlation for signs and symptoms of acute sinusitis is recommended.  CT MAXILLOFACIAL  Findings:   No acute displaced facial bone fractures are identified.  Mandibular condyles are located bilaterally.  Minimal soft tissue swelling in the left frontal scalp.  Mild mucosal thickening throughout the ethmoid sinuses and maxillary sinuses bilaterally, with frothy secretions layering dependently in the right maxillary sinus, and intermediate attenuation fluid layering dependently in the left maxillary sinus.  IMPRESSION:  1.  Mild soft tissue swelling the left frontal scalp, without evidence of underlying displaced facial bone fractures. 2.  Paranasal sinus disease, as above, including frothy secretions in the right maxillary sinus and a dependent air-fluid level in the left maxillary sinus, favored to represent some inspissated secretions.  Clinical correlation for signs and symptoms of acute sinusitis is recommended.   Original Report Authenticated By: Trudie Reed, M.D.    Ct Maxillofacial Wo Cm  02/08/2012  *RADIOLOGY REPORT*  Clinical Data:  History of trauma from a fall.  CT HEAD WITHOUT CONTRAST CT MAXILLOFACIAL WITHOUT CONTRAST  Technique:  Multidetector CT imaging of the head and maxillofacial structures were performed using the standard protocol without intravenous contrast. Multiplanar CT image reconstructions of the maxillofacial structures were also generated.  Comparison:  Head CT 09/03/2010.  CT HEAD  Findings: There is a small amount of swelling in the left frontal scalp.  Moderate cerebral and cerebellar atrophy.  Extensive patchy and confluent areas of decreased attenuation throughout  the deep and periventricular white matter of the cerebral hemispheres bilaterally, compatible with chronic microvascular ischemic disease. 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 No acute displaced skull fractures are identified.  No acute intracranial abnormality.  Specifically, no evidence of acute post-traumatic intracranial hemorrhage, no definite regions of acute/subacute cerebral ischemia, no focal mass, mass effect, hydrocephalus or abnormal intra or extra-axial fluid collections. Mastoids are well pneumatized bilaterally.  There are patchy areas of mild mucosal thickening in the ethmoid sinuses and maxillary sinuses bilaterally, as well as a small fluid collection layering dependently in the left maxillary sinus (this is intermediate attenuation favored to represent inspissated secretions).  IMPRESSION: 1.  Small amount of soft tissue swelling in the left frontal scalp without evidence of underlying displaced skull fracture or acute intracranial abnormalities. 2.  Moderate cerebral and cerebellar atrophy with extensive chronic microvascular ischemic changes in the brain, as above. 3.  Mild paranasal sinus disease, including a small air-fluid level in the left maxillary sinus.  Clinical correlation for signs and symptoms of acute sinusitis is recommended.  CT MAXILLOFACIAL  Findings:   No acute displaced facial bone fractures are identified.  Mandibular condyles are located bilaterally.  Minimal soft tissue swelling in the left frontal scalp.  Mild mucosal thickening throughout the ethmoid sinuses and maxillary sinuses bilaterally, with frothy secretions layering dependently in the right maxillary sinus, and intermediate attenuation fluid layering dependently in the left maxillary sinus.  IMPRESSION:  1.  Mild soft tissue swelling the left frontal scalp, without evidence of underlying displaced facial bone fractures.  2.  Paranasal sinus disease, as above, including frothy secretions in the right maxillary sinus and a dependent air-fluid level in the left maxillary  sinus, favored to represent some inspissated secretions.  Clinical correlation for signs and symptoms of acute sinusitis is recommended.   Original Report Authenticated By: Trudie Reed, M.D.      1. Hematoma   2. Abrasion   3. Fall       MDM  Pt denies any sinus symptoms:pt is neurologically intact:pt is okay to follow up as needed        Teressa Lower, NP 02/08/12 [phone]

## 2012-02-08 NOTE — ED Notes (Signed)
Ptar called 

## 2012-02-08 NOTE — Discharge Instructions (Signed)
Abrasions An abrasion is a cut or scrape of the skin. Abrasions do not go through all layers of the skin. HOME CARE  If a bandage (dressing) was put on your wound, change it as told by your doctor. If the bandage sticks, soak it off with warm.  Wash the area with water and soap 2 times a day. Rinse off the soap in a sink or under a bath or shower faucet. Pat the area dry with a clean towel.  Put on medicated cream (ointment) as told by your doctor.  Change your bandage right away if it gets wet or dirty.  Only take medicine as told by your doctor.  See your doctor within 24 to 48 hours to get your wound checked.  Check your wound for redness, puffiness (swelling), or yellowish-white fluid (pus). GET HELP RIGHT AWAY IF:   You have more pain in the wound.  You have redness, swelling, or tenderness around the wound.  You have pus coming from the wound.  You have a fever.  You have a bad smell coming from the wound or bandage. MAKE SURE YOU:   Understand these instructions.  Will watch your condition.  Will get help right away if you are not doing well or get worse. Document Released: 06/15/2007 Document Revised: 03/21/2011 Document Reviewed: 11/30/2010 [facility] Patient Information 2013 [place], [place]. Contusion A contusion is a deep bruise. Contusions happen when an injury causes bleeding under the skin. Signs of bruising include pain, puffiness (swelling), and discolored skin. The contusion may turn blue, purple, or yellow. HOME CARE   Put ice on the injured area.  Put ice in a plastic bag.  Place a towel between your skin and the bag.  Leave the ice on for 15 to 20 minutes, 3 to 4 times a day.  Only take medicine as told by your doctor.  Rest the injured area.  If possible, raise (elevate) the injured area to lessen puffiness. GET HELP RIGHT AWAY IF:   You have more bruising or puffiness.  You have pain that is getting worse.  Your puffiness or pain is not  helped by medicine. MAKE SURE YOU:   Understand these instructions.  Will watch your condition.  Will get help right away if you are not doing well or get worse. Document Released: 06/15/2007 Document Revised: 03/21/2011 Document Reviewed: 11/01/2010 [facility] Patient Information 2013 [place], [place].

## 2012-02-09 NOTE — ED Provider Notes (Signed)
Medical screening examination/treatment/procedure(s) were performed by non-physician practitioner and as supervising physician I was immediately available for consultation/collaboration.  Leinaala Catanese T Cederic Mozley, MD 02/09/12 0905 

## 2012-06-01 ENCOUNTER — Encounter (HOSPITAL_COMMUNITY): Payer: Self-pay

## 2012-06-01 ENCOUNTER — Emergency Department (HOSPITAL_COMMUNITY): Payer: Medicare Other

## 2012-06-01 ENCOUNTER — Emergency Department (HOSPITAL_COMMUNITY)
Admission: EM | Admit: 2012-06-01 | Discharge: 2012-06-01 | Disposition: A | Discharge status: Skilled Nursing Facility | Payer: Medicare Other | Attending: Emergency Medicine | Admitting: Emergency Medicine

## 2012-06-01 DIAGNOSIS — IMO0002 Reserved for concepts with insufficient information to code with codable children: Secondary | ICD-10-CM | POA: Insufficient documentation

## 2012-06-01 DIAGNOSIS — E119 Type 2 diabetes mellitus without complications: Secondary | ICD-10-CM | POA: Insufficient documentation

## 2012-06-01 DIAGNOSIS — Z79899 Other long term (current) drug therapy: Secondary | ICD-10-CM | POA: Insufficient documentation

## 2012-06-01 DIAGNOSIS — G4733 Obstructive sleep apnea (adult) (pediatric): Secondary | ICD-10-CM | POA: Insufficient documentation

## 2012-06-01 DIAGNOSIS — Z794 Long term (current) use of insulin: Secondary | ICD-10-CM | POA: Insufficient documentation

## 2012-06-01 DIAGNOSIS — Y9210 Unspecified residential institution as the place of occurrence of the external cause: Secondary | ICD-10-CM | POA: Insufficient documentation

## 2012-06-01 DIAGNOSIS — Y9389 Activity, other specified: Secondary | ICD-10-CM | POA: Insufficient documentation

## 2012-06-01 DIAGNOSIS — W050XXA Fall from non-moving wheelchair, initial encounter: Secondary | ICD-10-CM | POA: Insufficient documentation

## 2012-06-01 DIAGNOSIS — Z7982 Long term (current) use of aspirin: Secondary | ICD-10-CM | POA: Insufficient documentation

## 2012-06-01 DIAGNOSIS — I10 Essential (primary) hypertension: Secondary | ICD-10-CM | POA: Insufficient documentation

## 2012-06-01 DIAGNOSIS — W19XXXA Unspecified fall, initial encounter: Secondary | ICD-10-CM

## 2012-06-01 DIAGNOSIS — I8310 Varicose veins of unspecified lower extremity with inflammation: Secondary | ICD-10-CM | POA: Insufficient documentation

## 2012-06-01 DIAGNOSIS — S022XXA Fracture of nasal bones, initial encounter for closed fracture: Secondary | ICD-10-CM

## 2012-06-01 DIAGNOSIS — E785 Hyperlipidemia, unspecified: Secondary | ICD-10-CM | POA: Insufficient documentation

## 2012-06-01 DIAGNOSIS — J449 Chronic obstructive pulmonary disease, unspecified: Secondary | ICD-10-CM | POA: Insufficient documentation

## 2012-06-01 DIAGNOSIS — R040 Epistaxis: Secondary | ICD-10-CM

## 2012-06-01 DIAGNOSIS — J4489 Other specified chronic obstructive pulmonary disease: Secondary | ICD-10-CM | POA: Insufficient documentation

## 2012-06-01 DIAGNOSIS — S0081XA Abrasion of other part of head, initial encounter: Secondary | ICD-10-CM

## 2012-06-01 DIAGNOSIS — Z96659 Presence of unspecified artificial knee joint: Secondary | ICD-10-CM | POA: Insufficient documentation

## 2012-06-01 LAB — GLUCOSE, CAPILLARY: Glucose-Capillary: 86 mg/dL (ref 70–99)

## 2012-06-01 MED ORDER — PHENYLEPHRINE HCL 1 % NA SOLN
2.0000 [drp] | Freq: Once | NASAL | Status: DC | PRN
  Filled 2012-06-01: qty 15

## 2012-06-01 NOTE — ED Notes (Signed)
Report called to [employer] at [facility].

## 2012-06-01 NOTE — ED Notes (Addendum)
Patient right nare continuing to bleed, holding pressure. Face cleaned up with soap and water. Left face with an open wound under left eye and abrasions to left eye area.

## 2012-06-01 NOTE — ED Notes (Signed)
PER EMS patient from [place] [facility]. Patient was in wheelchair thought her wheelchair battery was low. Patient stood up and attempted to push wheelchair inside and fell. She caught herself with her hands but hit with face head on. Can pivot, unable to walk. Complained of nose pain 1/10. 148/64, 72.

## 2012-06-01 NOTE — ED Notes (Signed)
UJW:[id number]<YN> Expected date:06/01/12<BR> Expected time: 6:54 AM<BR> Means of arrival:Ambulance<BR> Comments:<BR> Fall, facial injury

## 2012-06-01 NOTE — ED Provider Notes (Signed)
History     CSN: [id number]  Arrival date & time 06/01/12  0712   First MD Initiated Contact with Patient 06/01/12 [phone]      Chief Complaint  Patient presents with  . Fall     HPI Pt was seen at 0725.  Per EMS, NH report and pt, c/o sudden onset and resolution of one episode of slip and fall that occurred PTA. Pt states she thought her motorized wheelchair "battery was low" so she stood up to push it when she slipped and fell down. States she "doesn't really walk" per baseline, only pivots.  Pt states she fell forward directly onto her face.  States her "nose was bleeding" after she fell.  Denies LOC, no syncope, no neck or back pain, no CP/SOB, no abd pain, N/V/D, no prodromal symptoms before fall, no focal motor weakness, no tingling/numbness in extremities.    Td UTD Past Medical History  Diagnosis Date  . COPD (chronic obstructive pulmonary disease)   . Diabetes mellitus   . Hypertension   . Urinary incontinence   . Stasis dermatitis   . Hyperlipidemia   . Obstructive sleep apnea     Patient denies  . Morbid obesity     Past Surgical History  Procedure Laterality Date  . Carpal tunnel release      Patient denies  . Joint replacement      Knee    Family History  Problem Relation Age of Onset  . Esophageal cancer Brother   . Cirrhosis Mother     History  Substance Use Topics  . Smoking status: Former Smoker    Types: Cigarettes  . Smokeless tobacco: Never Used  . Alcohol Use: No     Review of Systems ROS: Statement: All systems negative except as marked or noted in the HPI; Constitutional: Negative for fever and chills. ; ; Eyes: Negative for eye pain, redness and discharge. ; ; ENMT: +epistaxis. Negative for ear pain, hoarseness, nasal congestion, sinus pressure and sore throat. ; ; Cardiovascular: Negative for chest pain, palpitations, diaphoresis, dyspnea and peripheral edema. ; ; Respiratory: Negative for cough, wheezing and stridor. ; ; Gastrointestinal:  Negative for nausea, vomiting, diarrhea, abdominal pain, blood in stool, hematemesis, jaundice and rectal bleeding. . ; ; Genitourinary: Negative for dysuria, flank pain and hematuria. ; ; Musculoskeletal: Negative for back pain and neck pain. Negative for swelling and trauma.; ; Skin: +abrasions.  Negative for pruritus, rash, blisters, bruising and skin lesion.; ; Neuro: Negative for headache, lightheadedness and neck stiffness. Negative for weakness, altered level of consciousness , altered mental status, extremity weakness, paresthesias, involuntary movement, seizure and syncope.      Allergies  Codeine and Ciprofloxacin  Home Medications   Current Outpatient Rx  Name  Route  Sig  Dispense  Refill  . acetaminophen (TYLENOL) 500 MG tablet   Oral   Take 500 mg by mouth every 4 (four) hours as needed. For pain. **Max 4 tab in 24 hrs.**         . albuterol (PROAIR HFA) 108 (90 BASE) MCG/ACT inhaler   Inhalation   Inhale 2 puffs into the lungs every 4 (four) hours as needed. For shortness of breath/wheezing         . aspirin EC 81 MG tablet   Oral   Take 81 mg by mouth daily.         . cholecalciferol (VITAMIN D) 1000 UNITS tablet   Oral   Take 1,000 Units by mouth  daily.           . Cranberry 425 MG CAPS   Oral   Take 1 capsule by mouth 2 (two) times daily.         . famotidine (PEPCID) 40 MG tablet   Oral   Take 40 mg by mouth daily.           . ferrous sulfate 325 (65 FE) MG tablet   Oral   Take 325 mg by mouth daily.         . hydrochlorothiazide (HYDRODIURIL) 25 MG tablet   Oral   Take 25 mg by mouth daily.           . insulin aspart (NOVOLOG FLEXPEN) 100 UNIT/ML injection   Subcutaneous   Inject 10 Units into the skin 3 (three) times daily before meals. HOLD IF BLOOD SUGAR IS LESS THAN 65 OR IF PATIENT DOES NOT EAT         . Insulin Glargine (LANTUS SOLOSTAR [place])   Subcutaneous   Inject 52 Units into the skin 2 (two) times daily.          .  Lactobacillus (ACIDOPHILUS PO)   Oral   Take 2 capsules by mouth daily. DOSAGE UNKNOWN          . levalbuterol (XOPENEX) 1.25 MG/3ML nebulizer solution   Nebulization   Take 1.25 mg by nebulization every 4 (four) hours as needed. For shortness of breath/wheezing.         Marland Kitchen lisinopril (PRINIVIL,ZESTRIL) 5 MG tablet   Oral   Take 5 mg by mouth daily.           Marland Kitchen loperamide (IMODIUM A-D) 2 MG tablet   Oral   Take 2-4 mg by mouth 4 (four) times daily as needed. For diarrhea/loose stools. Take 2 capsules 1st dose, then 1 capsule after each loose stool **max 4 caps in 24 hrs.**         . Loratadine 10 MG CAPS   Oral   Take 10 mg by mouth daily as needed. FOR SEASONAL ALLERGIES         . Melatonin 3 MG CAPS   Oral   Take 1 capsule by mouth at bedtime as needed. For sleep.         . metFORMIN (GLUCOPHAGE) 500 MG tablet   Oral   Take 1,000 mg by mouth 2 (two) times daily with a meal.           . oxymetazoline (AFRIN) 0.05 % nasal spray   Nasal   Place 1 spray into the nose at bedtime as needed. For nasal congestion. **Max 3 days in a row**         . EXPIRED: pregabalin (LYRICA) 50 MG capsule   Oral   Take 1 capsule (50 mg total) by mouth 3 (three) times daily.   90 capsule   3   . simvastatin (ZOCOR) 20 MG tablet   Oral   Take 20 mg by mouth at bedtime.             BP 127/34  Pulse 72  Resp 18  SpO2 96%  Physical Exam 0730: Physical examination: Vital signs and O2 SAT: Reviewed; Constitutional: Well developed, Well nourished, Well hydrated, In no acute distress; Head and Face: Normocephalic, No scalp hematomas, no lacs. +multiple superficial facial abrasions. Non-tender to palp superior and inferior orbital rim areas.  No zygoma tenderness.  No mandibular tenderness.; Eyes: EOMI, PERRL, No scleral icterus;  ENMT: Mouth and pharynx normal, Left TM normal, Right TM normal, Mucous membranes moist, +teeth and tongue intact.  No intraoral or intranasal active  bleeding. +blood clot right nares with dried blood in mouth. No septal hematomas. +nasal bones TTP.  No trismus, no malocclusion.; Neck: Supple,Trachea midline; Spine: No midline CS, TS, LS tenderness.; Cardiovascular: Regular rate and rhythm, No gallop; Respiratory: Breath sounds clear & equal bilaterally, No wheezes, Normal respiratory effort/excursion; Chest: Nontender, No deformity, Movement normal, No crepitus, No abrasions or ecchymosis.; Abdomen: Soft, Nontender, Nondistended, Normal bowel sounds, No abrasions or ecchymosis.; Genitourinary: No CVA tenderness;.; Extremities: No deformity, Full range of motion major/large joints of bilat UE's and LE's without pain or tenderness to palp, Neurovascularly intact, Pulses normal, No tenderness, No edema, Pelvis stable; Neuro: AA&Ox3, GCS 15.  Major CN grossly intact. Speech clear. Moves all ext on stretcher without apparent gross focal motor deficits.; Skin: Color normal, Warm, Dry   ED Course  Procedures   0800:  Right nares blood clot suctioned clear. No active epistaxis. Neo-synephrine topically sprayed to right nares. Will continue to monitor.   1015:  Pt states she "wants to go home now."  No active epistaxis right or left nares.  No blood in posterior pharynx.  Neuro exam unchanged. VSS. Dx and testing d/w pt.  Questions answered.  Verb understanding, agreeable to d/c home with outpt f/u.    MDM  MDM Reviewed: previous chart, nursing note and vitals Interpretation: CT scan   Results for orders placed during the hospital encounter of 06/01/12  GLUCOSE, CAPILLARY      Result Value Range   Glucose-Capillary 86  70 - 99 mg/dL   Ct Head Wo Contrast 04/18/8117   *RADIOLOGY REPORT*  Clinical Data:  Larey Seat while pushing a motorized wheel chair with a bed battery, struck face, nosebleed, left inferior orbital laceration, weakness, pain, history COPD, diabetes, hypertension, hyperlipidemia  CT HEAD WITHOUT CONTRAST CT MAXILLOFACIAL WITHOUT CONTRAST  CT CERVICAL SPINE WITHOUT CONTRAST  Technique:  Multidetector CT imaging of the head, cervical spine, and maxillofacial structures were performed using the standard protocol without intravenous contrast. Multiplanar CT image reconstructions of the cervical spine and maxillofacial structures were also generated.  Comparison:  CT head and maxillofacial 02/08/2012 Correlation:  CT chest 03/13/2006  CT HEAD  Findings: Generalized atrophy. Normal ventricular morphology. No midline shift or mass effect. Extensive small vessel chronic ischemic changes of deep cerebral white matter, including within the pons and middle cerebral peduncles. No intracranial hemorrhage, mass lesion or evidence of acute infarction. No extra-axial fluid collections. Atherosclerotic calcifications at skull base. No calvarial abnormalities.  IMPRESSION: Atrophy with extensive small vessel chronic ischemic changes of deep cerebral white matter. No acute intracranial abnormalities.  CT MAXILLOFACIAL  Findings:  Intraorbital soft tissue planes clear. Small left periorbital and supraorbital hematoma. Air fluid levels in the bilateral maxillary sinuses. Mucosal thickening bilateral ethmoid air cells and sphenoid sinus. Bilateral nasal bone fractures minimally displaced on the left. Nasal septal deviation to the right. No additional facial bone fracture identified. Scattered periodontal disease.  IMPRESSION: Bilateral nasal bone fractures.  CT CERVICAL SPINE  Findings: Question sialolith in right parotid gland. Extensive carotid vascular calcifications in the cervical region extending to aortic arch. Calcified nodule right thyroid lobe 13 x 11 mm image 60. Visualized skull base intact. Diffuse osseous demineralization. Multilevel facet degenerative changes. Scattered disc space narrowing and endplate spur formation. Prevertebral soft tissues normal thickness. Superior endplate compression deformities identified at T1 and T2, new since  2008. The presence  of potential mild sclerosis within the superior aspects of the vertebral bodies at both levels however suggests these may be subacute/healing. No additional fracture, subluxation, or bone destruction.  IMPRESSION: Multilevel degenerative disc and facet disease changes of the cervical spine. Superior endplate compression deformities at T1-T2, new since 2008 though the presence of potential mild sclerosis within the vertebral bodies suggests these may be potentially subacute/healing; recommend correlation with patient history and symptoms.   Original Report Authenticated By: Ulyses Southward, M.D.   Ct Cervical Spine Wo Contrast 06/01/2012   *RADIOLOGY REPORT*  Clinical Data:  Larey Seat while pushing a motorized wheel chair with a bed battery, struck face, nosebleed, left inferior orbital laceration, weakness, pain, history COPD, diabetes, hypertension, hyperlipidemia  CT HEAD WITHOUT CONTRAST CT MAXILLOFACIAL WITHOUT CONTRAST CT CERVICAL SPINE WITHOUT CONTRAST  Technique:  Multidetector CT imaging of the head, cervical spine, and maxillofacial structures were performed using the standard protocol without intravenous contrast. Multiplanar CT image reconstructions of the cervical spine and maxillofacial structures were also generated.  Comparison:  CT head and maxillofacial 02/08/2012 Correlation:  CT chest 03/13/2006  CT HEAD  Findings: Generalized atrophy. Normal ventricular morphology. No midline shift or mass effect. Extensive small vessel chronic ischemic changes of deep cerebral white matter, including within the pons and middle cerebral peduncles. No intracranial hemorrhage, mass lesion or evidence of acute infarction. No extra-axial fluid collections. Atherosclerotic calcifications at skull base. No calvarial abnormalities.  IMPRESSION: Atrophy with extensive small vessel chronic ischemic changes of deep cerebral white matter. No acute intracranial abnormalities.  CT MAXILLOFACIAL  Findings:  Intraorbital soft tissue  planes clear. Small left periorbital and supraorbital hematoma. Air fluid levels in the bilateral maxillary sinuses. Mucosal thickening bilateral ethmoid air cells and sphenoid sinus. Bilateral nasal bone fractures minimally displaced on the left. Nasal septal deviation to the right. No additional facial bone fracture identified. Scattered periodontal disease.  IMPRESSION: Bilateral nasal bone fractures.  CT CERVICAL SPINE  Findings: Question sialolith in right parotid gland. Extensive carotid vascular calcifications in the cervical region extending to aortic arch. Calcified nodule right thyroid lobe 13 x 11 mm image 60. Visualized skull base intact. Diffuse osseous demineralization. Multilevel facet degenerative changes. Scattered disc space narrowing and endplate spur formation. Prevertebral soft tissues normal thickness. Superior endplate compression deformities identified at T1 and T2, new since 2008. The presence of potential mild sclerosis within the superior aspects of the vertebral bodies at both levels however suggests these may be subacute/healing. No additional fracture, subluxation, or bone destruction.  IMPRESSION: Multilevel degenerative disc and facet disease changes of the cervical spine. Superior endplate compression deformities at T1-T2, new since 2008 though the presence of potential mild sclerosis within the vertebral bodies suggests these may be potentially subacute/healing; recommend correlation with patient history and symptoms.   Original Report Authenticated By: Ulyses Southward, M.D.   Ct Maxillofacial Wo Cm 06/01/2012   *RADIOLOGY REPORT*  Clinical Data:  Larey Seat while pushing a motorized wheel chair with a bed battery, struck face, nosebleed, left inferior orbital laceration, weakness, pain, history COPD, diabetes, hypertension, hyperlipidemia  CT HEAD WITHOUT CONTRAST CT MAXILLOFACIAL WITHOUT CONTRAST CT CERVICAL SPINE WITHOUT CONTRAST  Technique:  Multidetector CT imaging of the head, cervical  spine, and maxillofacial structures were performed using the standard protocol without intravenous contrast. Multiplanar CT image reconstructions of the cervical spine and maxillofacial structures were also generated.  Comparison:  CT head and maxillofacial 02/08/2012 Correlation:  CT chest 03/13/2006  CT HEAD  Findings: Generalized  atrophy. Normal ventricular morphology. No midline shift or mass effect. Extensive small vessel chronic ischemic changes of deep cerebral white matter, including within the pons and middle cerebral peduncles. No intracranial hemorrhage, mass lesion or evidence of acute infarction. No extra-axial fluid collections. Atherosclerotic calcifications at skull base. No calvarial abnormalities.  IMPRESSION: Atrophy with extensive small vessel chronic ischemic changes of deep cerebral white matter. No acute intracranial abnormalities.  CT MAXILLOFACIAL  Findings:  Intraorbital soft tissue planes clear. Small left periorbital and supraorbital hematoma. Air fluid levels in the bilateral maxillary sinuses. Mucosal thickening bilateral ethmoid air cells and sphenoid sinus. Bilateral nasal bone fractures minimally displaced on the left. Nasal septal deviation to the right. No additional facial bone fracture identified. Scattered periodontal disease.  IMPRESSION: Bilateral nasal bone fractures.  CT CERVICAL SPINE  Findings: Question sialolith in right parotid gland. Extensive carotid vascular calcifications in the cervical region extending to aortic arch. Calcified nodule right thyroid lobe 13 x 11 mm image 60. Visualized skull base intact. Diffuse osseous demineralization. Multilevel facet degenerative changes. Scattered disc space narrowing and endplate spur formation. Prevertebral soft tissues normal thickness. Superior endplate compression deformities identified at T1 and T2, new since 2008. The presence of potential mild sclerosis within the superior aspects of the vertebral bodies at both levels  however suggests these may be subacute/healing. No additional fracture, subluxation, or bone destruction.  IMPRESSION: Multilevel degenerative disc and facet disease changes of the cervical spine. Superior endplate compression deformities at T1-T2, new since 2008 though the presence of potential mild sclerosis within the vertebral bodies suggests these may be potentially subacute/healing; recommend correlation with patient history and symptoms.   Original Report Authenticated By: Ulyses Southward, M.D.             Laray Anger, DO 06/03/12 2039

## 2012-06-01 NOTE — ED Notes (Signed)
Patient transported to CT 

## 2012-08-05 ENCOUNTER — Encounter (HOSPITAL_COMMUNITY): Payer: Self-pay | Admitting: *Deleted

## 2012-08-05 ENCOUNTER — Emergency Department (HOSPITAL_COMMUNITY)
Admission: EM | Admit: 2012-08-05 | Discharge: 2012-08-05 | Disposition: A | Discharge status: Skilled Nursing Facility | Payer: Medicare Other | Attending: Emergency Medicine | Admitting: Emergency Medicine

## 2012-08-05 DIAGNOSIS — W010XXA Fall on same level from slipping, tripping and stumbling without subsequent striking against object, initial encounter: Secondary | ICD-10-CM | POA: Insufficient documentation

## 2012-08-05 DIAGNOSIS — Y9210 Unspecified residential institution as the place of occurrence of the external cause: Secondary | ICD-10-CM | POA: Insufficient documentation

## 2012-08-05 DIAGNOSIS — Z794 Long term (current) use of insulin: Secondary | ICD-10-CM | POA: Insufficient documentation

## 2012-08-05 DIAGNOSIS — Z87891 Personal history of nicotine dependence: Secondary | ICD-10-CM | POA: Insufficient documentation

## 2012-08-05 DIAGNOSIS — Z79899 Other long term (current) drug therapy: Secondary | ICD-10-CM | POA: Insufficient documentation

## 2012-08-05 DIAGNOSIS — W19XXXA Unspecified fall, initial encounter: Secondary | ICD-10-CM

## 2012-08-05 DIAGNOSIS — J4489 Other specified chronic obstructive pulmonary disease: Secondary | ICD-10-CM | POA: Insufficient documentation

## 2012-08-05 DIAGNOSIS — E785 Hyperlipidemia, unspecified: Secondary | ICD-10-CM | POA: Insufficient documentation

## 2012-08-05 DIAGNOSIS — Z7982 Long term (current) use of aspirin: Secondary | ICD-10-CM | POA: Insufficient documentation

## 2012-08-05 DIAGNOSIS — I10 Essential (primary) hypertension: Secondary | ICD-10-CM | POA: Insufficient documentation

## 2012-08-05 DIAGNOSIS — S41109A Unspecified open wound of unspecified upper arm, initial encounter: Secondary | ICD-10-CM | POA: Insufficient documentation

## 2012-08-05 DIAGNOSIS — J449 Chronic obstructive pulmonary disease, unspecified: Secondary | ICD-10-CM | POA: Insufficient documentation

## 2012-08-05 DIAGNOSIS — Z872 Personal history of diseases of the skin and subcutaneous tissue: Secondary | ICD-10-CM | POA: Insufficient documentation

## 2012-08-05 DIAGNOSIS — G4733 Obstructive sleep apnea (adult) (pediatric): Secondary | ICD-10-CM | POA: Insufficient documentation

## 2012-08-05 DIAGNOSIS — R109 Unspecified abdominal pain: Secondary | ICD-10-CM | POA: Insufficient documentation

## 2012-08-05 DIAGNOSIS — IMO0002 Reserved for concepts with insufficient information to code with codable children: Secondary | ICD-10-CM

## 2012-08-05 DIAGNOSIS — E119 Type 2 diabetes mellitus without complications: Secondary | ICD-10-CM | POA: Insufficient documentation

## 2012-08-05 DIAGNOSIS — Y939 Activity, unspecified: Secondary | ICD-10-CM | POA: Insufficient documentation

## 2012-08-05 NOTE — ED Provider Notes (Signed)
CSN: [id number]     Arrival date & time 08/05/12  1134 History     First MD Initiated Contact with Patient 08/05/12 1140     Chief Complaint  Patient presents with  . Fall   (Consider location/radiation/quality/duration/timing/severity/associated sxs/prior Treatment) HPI Pt with slip and fall at nursing home with small skin tear to R upper arm. Pt states she did not hit her head or neck. No LOC. She is alert and oriented. No weakness, numbness, fever, chills, N/V/D, CP or abd pain.  Past Medical History  Diagnosis Date  . COPD (chronic obstructive pulmonary disease)   . Diabetes mellitus   . Hypertension   . Urinary incontinence   . Stasis dermatitis   . Hyperlipidemia   . Obstructive sleep apnea     Patient denies  . Morbid obesity    Past Surgical History  Procedure Laterality Date  . Carpal tunnel release      Patient denies  . Joint replacement      Knee   Family History  Problem Relation Age of Onset  . Esophageal cancer Brother   . Cirrhosis Mother    History  Substance Use Topics  . Smoking status: Former Smoker    Types: Cigarettes  . Smokeless tobacco: Never Used  . Alcohol Use: No   OB History   Grav Para Term Preterm Abortions TAB SAB Ect Mult Living                 Review of Systems  Constitutional: Negative for fever, chills and fatigue.  HENT: Negative for neck pain and neck stiffness.   Eyes: Negative for visual disturbance.  Respiratory: Negative for shortness of breath.   Cardiovascular: Negative for chest pain.  Gastrointestinal: Positive for abdominal pain. Negative for nausea, vomiting and diarrhea.  Genitourinary: Negative for dysuria, frequency and flank pain.  Musculoskeletal: Negative for myalgias and back pain.  Skin: Positive for wound.  Neurological: Negative for dizziness, weakness, light-headedness, numbness and headaches.  All other systems reviewed and are negative.    Allergies  Codeine and Ciprofloxacin  Home  Medications   Current Outpatient Rx  Name  Route  Sig  Dispense  Refill  . acetaminophen (TYLENOL) 500 MG tablet   Oral   Take 500 mg by mouth every 4 (four) hours as needed. For pain. **Max 4 tab in 24 hrs.**         . acidophilus (RISAQUAD) CAPS   Oral   Take 2 capsules by mouth daily.         Marland Kitchen albuterol (PROAIR HFA) 108 (90 BASE) MCG/ACT inhaler   Inhalation   Inhale 2 puffs into the lungs every 4 (four) hours as needed. For shortness of breath/wheezing         . aspirin EC 81 MG tablet   Oral   Take 81 mg by mouth daily.         . cholecalciferol (VITAMIN D) 1000 UNITS tablet   Oral   Take 1,000 Units by mouth daily.           . citalopram (CELEXA) 20 MG tablet   Oral   Take 20 mg by mouth daily.         . Cranberry 425 MG CAPS   Oral   Take 1 capsule by mouth 2 (two) times daily.         . famotidine (PEPCID) 40 MG tablet   Oral   Take 40 mg by mouth daily.           Marland Kitchen  ferrous sulfate 325 (65 FE) MG tablet   Oral   Take 325 mg by mouth daily.         . hydrochlorothiazide (HYDRODIURIL) 25 MG tablet   Oral   Take 25 mg by mouth daily.           . insulin aspart (NOVOLOG FLEXPEN) 100 UNIT/ML injection   Subcutaneous   Inject 10 Units into the skin 3 (three) times daily before meals. 5 UNITS IF SUGARS ARE OVER 200         . Insulin Glargine (LANTUS SOLOSTAR [place])   Subcutaneous   Inject 40 Units into the skin 2 (two) times daily.          . Lactobacillus (ACIDOPHILUS PO)   Oral   Take 2 capsules by mouth daily. DOSAGE UNKNOWN          . levalbuterol (XOPENEX) 1.25 MG/3ML nebulizer solution   Nebulization   Take 1.25 mg by nebulization every 4 (four) hours as needed. For shortness of breath/wheezing.         Marland Kitchen lisinopril (PRINIVIL,ZESTRIL) 10 MG tablet   Oral   Take 10 mg by mouth daily.         Marland Kitchen loperamide (IMODIUM A-D) 2 MG tablet   Oral   Take 2-4 mg by mouth 4 (four) times daily as needed. For diarrhea/loose stools.  Take 2 capsules 1st dose, then 1 capsule after each loose stool **max 4 caps in 24 hrs.**         . Loratadine 10 MG CAPS   Oral   Take 10 mg by mouth daily as needed. FOR SEASONAL ALLERGIES         . Melatonin 3 MG CAPS   Oral   Take 1 capsule by mouth at bedtime as needed. For sleep.         . metFORMIN (GLUCOPHAGE) 500 MG tablet   Oral   Take 1,000 mg by mouth 2 (two) times daily with a meal.          . nitrofurantoin, macrocrystal-monohydrate, (MACROBID) 100 MG capsule   Oral   Take 100 mg by mouth 2 (two) times daily. Pt started on 05-29-12. For 14 day therapy         . oxymetazoline (AFRIN) 0.05 % nasal spray   Nasal   Place 1 spray into the nose at bedtime as needed. For nasal congestion. **Max 3 days in a row**         . EXPIRED: pregabalin (LYRICA) 50 MG capsule   Oral   Take 1 capsule (50 mg total) by mouth 3 (three) times daily.   90 capsule   3   . simvastatin (ZOCOR) 10 MG tablet   Oral   Take 10 mg by mouth at bedtime.          BP 114/31  Pulse 68  Temp(Src) 98.1 F (36.7 C) (Oral)  Resp 16  Ht 5\' 2"  (1.575 m)  Wt 204 lb (92.534 kg)  BMI 37.3 kg/m2  SpO2 96% Physical Exam  Nursing note and vitals reviewed. Constitutional: She is oriented to person, place, and time. She appears well-developed and well-nourished. No distress.  HENT:  Head: Normocephalic and atraumatic.  Mouth/Throat: Oropharynx is clear and moist. No oropharyngeal exudate.  Eyes: EOM are normal. Pupils are equal, round, and reactive to light.  Neck: Normal range of motion. Neck supple.  No posterior cervical tenderness  Cardiovascular: Normal rate and regular rhythm.   Pulmonary/Chest: Effort  normal and breath sounds normal. No respiratory distress. She has no wheezes. She has no rales.  Abdominal: Soft. Bowel sounds are normal. She exhibits no distension and no mass. There is no tenderness. There is no rebound and no guarding.  Musculoskeletal: Normal range of motion.  She exhibits no edema and no tenderness.  2+ distal pulses. Moves all ext without pain or difficulty  Neurological: She is alert and oriented to person, place, and time.  5./5 motor in all ext, sensation intact  Skin: Skin is warm and dry. No rash noted. No erythema.  1 cm superficial skin laceration to R upper arm with no active bleeding.   Psychiatric: She has a normal mood and affect. Her behavior is normal.    ED Course   Procedures (including critical care time)  Labs Reviewed - No data to display No results found. No diagnosis found.  MDM  Will clean and dress wound. Pt medically cleared for return to NH. Pt is denies head injury and as normal exam.   Loren Racer, MD 08/05/12 1153

## 2012-08-05 NOTE — ED Notes (Signed)
[id number]> Expected date:<BR> Expected time:<BR> Means of arrival:<BR> Comments:<BR> EMS fall/skin tear

## 2012-08-05 NOTE — ED Notes (Signed)
Per  EMS pt coming from [facility] with c/o fall. Pt sts slipped and fell in bathroom; since the fall was un witnessed staff sent her over for the possibility she hit her head. Pt denies LOC or pain a this time. EMS reports small skin tear on right arm. VSS, pt in NAD

## 2012-08-10 ENCOUNTER — Emergency Department (HOSPITAL_COMMUNITY)
Admission: EM | Admit: 2012-08-10 | Discharge: 2012-08-10 | Disposition: A | Discharge status: Home / Self Care | Payer: Medicare Other | Attending: Emergency Medicine | Admitting: Emergency Medicine

## 2012-08-10 ENCOUNTER — Encounter (HOSPITAL_COMMUNITY): Payer: Self-pay | Admitting: Emergency Medicine

## 2012-08-10 ENCOUNTER — Emergency Department (HOSPITAL_COMMUNITY): Payer: Medicare Other

## 2012-08-10 DIAGNOSIS — J449 Chronic obstructive pulmonary disease, unspecified: Secondary | ICD-10-CM | POA: Insufficient documentation

## 2012-08-10 DIAGNOSIS — Z8679 Personal history of other diseases of the circulatory system: Secondary | ICD-10-CM | POA: Insufficient documentation

## 2012-08-10 DIAGNOSIS — Z7982 Long term (current) use of aspirin: Secondary | ICD-10-CM | POA: Insufficient documentation

## 2012-08-10 DIAGNOSIS — I10 Essential (primary) hypertension: Secondary | ICD-10-CM | POA: Insufficient documentation

## 2012-08-10 DIAGNOSIS — Z87891 Personal history of nicotine dependence: Secondary | ICD-10-CM | POA: Insufficient documentation

## 2012-08-10 DIAGNOSIS — E785 Hyperlipidemia, unspecified: Secondary | ICD-10-CM | POA: Insufficient documentation

## 2012-08-10 DIAGNOSIS — Z794 Long term (current) use of insulin: Secondary | ICD-10-CM | POA: Insufficient documentation

## 2012-08-10 DIAGNOSIS — E119 Type 2 diabetes mellitus without complications: Secondary | ICD-10-CM | POA: Insufficient documentation

## 2012-08-10 DIAGNOSIS — Z872 Personal history of diseases of the skin and subcutaneous tissue: Secondary | ICD-10-CM | POA: Insufficient documentation

## 2012-08-10 DIAGNOSIS — J4489 Other specified chronic obstructive pulmonary disease: Secondary | ICD-10-CM | POA: Insufficient documentation

## 2012-08-10 DIAGNOSIS — Z79899 Other long term (current) drug therapy: Secondary | ICD-10-CM | POA: Insufficient documentation

## 2012-08-10 DIAGNOSIS — N390 Urinary tract infection, site not specified: Secondary | ICD-10-CM | POA: Insufficient documentation

## 2012-08-10 DIAGNOSIS — Z8669 Personal history of other diseases of the nervous system and sense organs: Secondary | ICD-10-CM | POA: Insufficient documentation

## 2012-08-10 LAB — CBC WITH DIFFERENTIAL/PLATELET
Basophils Absolute: 0 10*3/uL (ref 0.0–0.1)
Basophils Relative: 0 % (ref 0–1)
Eosinophils Absolute: 0.6 10*3/uL (ref 0.0–0.7)
Hemoglobin: 14.6 g/dL (ref 12.0–15.0)
MCH: 30.4 pg (ref 26.0–34.0)
MCHC: 33.9 g/dL (ref 30.0–36.0)
Monocytes Relative: 6 % (ref 3–12)
Neutro Abs: 10.7 10*3/uL — ABNORMAL HIGH (ref 1.7–7.7)
Neutrophils Relative %: 70 % (ref 43–77)
Platelets: 276 10*3/uL (ref 150–400)
RDW: 15.5 % (ref 11.5–15.5)

## 2012-08-10 LAB — POCT I-STAT, CHEM 8
Creatinine, Ser: 0.6 mg/dL (ref 0.50–1.10)
HCT: 47 % — ABNORMAL HIGH (ref 36.0–46.0)
Hemoglobin: 16 g/dL — ABNORMAL HIGH (ref 12.0–15.0)
Potassium: 4.3 mEq/L (ref 3.5–5.1)
Sodium: 129 mEq/L — ABNORMAL LOW (ref 135–145)
TCO2: 24 mmol/L (ref 0–100)

## 2012-08-10 LAB — URINALYSIS, ROUTINE W REFLEX MICROSCOPIC
Hgb urine dipstick: NEGATIVE
Nitrite: POSITIVE — AB
Protein, ur: NEGATIVE mg/dL
Specific Gravity, Urine: 1.019 (ref 1.005–1.030)
Urobilinogen, UA: 1 mg/dL (ref 0.0–1.0)

## 2012-08-10 LAB — URINE MICROSCOPIC-ADD ON

## 2012-08-10 MED ORDER — NITROFURANTOIN MONOHYD MACRO 100 MG PO CAPS: 100.0000 mg | ORAL_CAPSULE | Freq: Two times a day (BID) | ORAL | Status: DC

## 2012-08-10 MED ORDER — DEXTROSE 5 % IV SOLN
1.0000 g | Freq: Once | INTRAVENOUS | Status: AC
  Administered 2012-08-10: 1 g via INTRAVENOUS
  Filled 2012-08-10: qty 10

## 2012-08-10 NOTE — ED Notes (Signed)
CT tech came to me and told me that while she took bra off, a orange round pill fell to the floor. RN Dorene Grebe and RN Darl Pikes notified.

## 2012-08-10 NOTE — ED Notes (Signed)
[id number]<GL> Expected date:<BR> Expected time:<BR> Means of arrival:<BR> Comments:<BR> EMS-?uti

## 2012-08-10 NOTE — ED Notes (Signed)
Pt getting IV abx before d/c

## 2012-08-10 NOTE — ED Notes (Signed)
[facility] called  
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
## 2012-08-10 NOTE — ED Notes (Signed)
Patient is alert and oriented x3.  She was given DC instructions and follow up visit instructions.  Patient gave verbal understanding. She was DC via [facility] to home.  V/S stable.  He was not showing any signs of distress on DC

## 2012-08-14 ENCOUNTER — Telehealth (HOSPITAL_COMMUNITY): Payer: Self-pay | Admitting: Emergency Medicine

## 2012-08-14 LAB — URINE CULTURE

## 2012-08-14 NOTE — ED Notes (Addendum)
Results called from [facility].  (+) URNC -> E Coli (ESBL) >/= 100,000 colonies.   Rx given in ED for Nitrofurantoin -> sensitive to the same.  Chart appended per MD protocol.

## 2012-08-15 NOTE — ED Notes (Signed)
Adequately treated-per Denise Lee 

## 2012-08-22 ENCOUNTER — Emergency Department (HOSPITAL_COMMUNITY): Payer: Medicare Other

## 2012-08-22 ENCOUNTER — Emergency Department (HOSPITAL_COMMUNITY)
Admission: EM | Admit: 2012-08-22 | Discharge: 2012-08-22 | Disposition: A | Discharge status: Home / Self Care | Payer: Medicare Other | Attending: Emergency Medicine | Admitting: Emergency Medicine

## 2012-08-22 ENCOUNTER — Encounter (HOSPITAL_COMMUNITY): Payer: Self-pay

## 2012-08-22 DIAGNOSIS — Z792 Long term (current) use of antibiotics: Secondary | ICD-10-CM | POA: Insufficient documentation

## 2012-08-22 DIAGNOSIS — Z87891 Personal history of nicotine dependence: Secondary | ICD-10-CM | POA: Insufficient documentation

## 2012-08-22 DIAGNOSIS — I10 Essential (primary) hypertension: Secondary | ICD-10-CM | POA: Insufficient documentation

## 2012-08-22 DIAGNOSIS — Z794 Long term (current) use of insulin: Secondary | ICD-10-CM | POA: Insufficient documentation

## 2012-08-22 DIAGNOSIS — S0031XA Abrasion of nose, initial encounter: Secondary | ICD-10-CM

## 2012-08-22 DIAGNOSIS — Y929 Unspecified place or not applicable: Secondary | ICD-10-CM | POA: Insufficient documentation

## 2012-08-22 DIAGNOSIS — IMO0002 Reserved for concepts with insufficient information to code with codable children: Secondary | ICD-10-CM | POA: Insufficient documentation

## 2012-08-22 DIAGNOSIS — J4489 Other specified chronic obstructive pulmonary disease: Secondary | ICD-10-CM | POA: Insufficient documentation

## 2012-08-22 DIAGNOSIS — Y9389 Activity, other specified: Secondary | ICD-10-CM | POA: Insufficient documentation

## 2012-08-22 DIAGNOSIS — E785 Hyperlipidemia, unspecified: Secondary | ICD-10-CM | POA: Insufficient documentation

## 2012-08-22 DIAGNOSIS — Z8669 Personal history of other diseases of the nervous system and sense organs: Secondary | ICD-10-CM | POA: Insufficient documentation

## 2012-08-22 DIAGNOSIS — Z7982 Long term (current) use of aspirin: Secondary | ICD-10-CM | POA: Insufficient documentation

## 2012-08-22 DIAGNOSIS — R32 Unspecified urinary incontinence: Secondary | ICD-10-CM | POA: Insufficient documentation

## 2012-08-22 DIAGNOSIS — S80211A Abrasion, right knee, initial encounter: Secondary | ICD-10-CM

## 2012-08-22 DIAGNOSIS — Z79899 Other long term (current) drug therapy: Secondary | ICD-10-CM | POA: Insufficient documentation

## 2012-08-22 DIAGNOSIS — W1809XA Striking against other object with subsequent fall, initial encounter: Secondary | ICD-10-CM | POA: Insufficient documentation

## 2012-08-22 DIAGNOSIS — W19XXXA Unspecified fall, initial encounter: Secondary | ICD-10-CM

## 2012-08-22 DIAGNOSIS — J449 Chronic obstructive pulmonary disease, unspecified: Secondary | ICD-10-CM | POA: Insufficient documentation

## 2012-08-22 DIAGNOSIS — E119 Type 2 diabetes mellitus without complications: Secondary | ICD-10-CM | POA: Insufficient documentation

## 2012-08-22 NOTE — ED Provider Notes (Signed)
CSN: [id number]     Arrival date & time 08/22/12  0801 History     First MD Initiated Contact with Patient 08/22/12 [phone]     Chief Complaint  Patient presents with  . Fall  . Facial Injury   (Consider location/radiation/quality/duration/timing/severity/associated sxs/prior Treatment) Patient is a 72 y.o. female presenting with fall and facial injury.  Fall  Facial Injury  Pt with history of falls brought to the ED via EMS from [facility] where she states she lost her balance and fell forward on to concrete this morning, hitting her nose and her R knee. Complaining of mild pain R knee, no LOC. No neck pain. Not on blood thinners.   Past Medical History  Diagnosis Date  . COPD (chronic obstructive pulmonary disease)   . Diabetes mellitus   . Hypertension   . Urinary incontinence   . Stasis dermatitis   . Hyperlipidemia   . Obstructive sleep apnea     Patient denies  . Morbid obesity    Past Surgical History  Procedure Laterality Date  . Carpal tunnel release      Patient denies  . Joint replacement      Knee   Family History  Problem Relation Age of Onset  . Esophageal cancer Brother   . Cirrhosis Mother    History  Substance Use Topics  . Smoking status: Former Smoker    Types: Cigarettes  . Smokeless tobacco: Never Used  . Alcohol Use: No   OB History   Grav Para Term Preterm Abortions TAB SAB Ect Mult Living                 Review of Systems All other systems reviewed and are negative except as noted in HPI.   Allergies  Codeine and Ciprofloxacin  Home Medications   Current Outpatient Rx  Name  Route  Sig  Dispense  Refill  . acetaminophen (TYLENOL) 500 MG tablet   Oral   Take 500 mg by mouth every 4 (four) hours as needed. For pain. **Max 4 tab in 24 hrs.**         . acidophilus (RISAQUAD) CAPS   Oral   Take 2 capsules by mouth daily.         Marland Kitchen albuterol (PROAIR HFA) 108 (90 BASE) MCG/ACT inhaler   Inhalation   Inhale 2 puffs into  the lungs every 4 (four) hours as needed. For shortness of breath/wheezing         . aspirin EC 81 MG tablet   Oral   Take 81 mg by mouth daily.         . cholecalciferol (VITAMIN D) 1000 UNITS tablet   Oral   Take 1,000 Units by mouth daily.           . citalopram (CELEXA) 20 MG tablet   Oral   Take 20 mg by mouth daily.         . Cranberry 425 MG CAPS   Oral   Take 1 capsule by mouth 2 (two) times daily.         . famotidine (PEPCID) 40 MG tablet   Oral   Take 40 mg by mouth daily.           . ferrous sulfate 325 (65 FE) MG tablet   Oral   Take 325 mg by mouth daily.         . hydrochlorothiazide (HYDRODIURIL) 25 MG tablet   Oral   Take 25 mg  by mouth daily.           . insulin aspart (NOVOLOG FLEXPEN) 100 UNIT/ML injection   Subcutaneous   Inject 10 Units into the skin 3 (three) times daily before meals. 5 UNITS IF SUGARS ARE OVER 200         . insulin detemir (LEVEMIR) 100 UNIT/ML injection   Subcutaneous   Inject 40 Units into the skin 2 (two) times daily.         Marland Kitchen levalbuterol (XOPENEX) 1.25 MG/3ML nebulizer solution   Nebulization   Take 1.25 mg by nebulization every 4 (four) hours as needed. For shortness of breath/wheezing.         Marland Kitchen lisinopril (PRINIVIL,ZESTRIL) 5 MG tablet   Oral   Take 5 mg by mouth daily.         Marland Kitchen loperamide (IMODIUM A-D) 2 MG tablet   Oral   Take 2-4 mg by mouth 4 (four) times daily as needed. For diarrhea/loose stools. Take 2 capsules 1st dose, then 1 capsule after each loose stool **max 4 caps in 24 hrs.**         . Loratadine 10 MG CAPS   Oral   Take 10 mg by mouth daily as needed. FOR SEASONAL ALLERGIES         . Melatonin 3 MG CAPS   Oral   Take 1 capsule by mouth at bedtime as needed. For sleep.         . metFORMIN (GLUCOPHAGE) 500 MG tablet   Oral   Take 1,000 mg by mouth 2 (two) times daily with a meal.          . nitrofurantoin, macrocrystal-monohydrate, (MACROBID) 100 MG capsule    Oral   Take 1 capsule (100 mg total) by mouth 2 (two) times daily.   20 capsule   0   . simvastatin (ZOCOR) 10 MG tablet   Oral   Take 10 mg by mouth at bedtime.          SpO2 93% Physical Exam  Nursing note and vitals reviewed. Constitutional: She is oriented to person, place, and time. She appears well-developed and well-nourished.  HENT:  Head: Normocephalic.  Abrasion bridge of nose; no septal hematoma  Eyes: EOM are normal. Pupils are equal, round, and reactive to light.  Neck: Normal range of motion. Neck supple.  Cardiovascular: Normal rate, normal heart sounds and intact distal pulses.   Pulmonary/Chest: Effort normal and breath sounds normal.  Abdominal: Bowel sounds are normal. She exhibits no distension. There is no tenderness.  Musculoskeletal: Normal range of motion. She exhibits no edema and no tenderness.  Superficial abrasion R knee, no bony tenderness or deformity  Neurological: She is alert and oriented to person, place, and time. She has normal strength. No cranial nerve deficit or sensory deficit.  Skin: Skin is warm and dry. No rash noted.  Psychiatric: She has a normal mood and affect.    ED Course   Procedures (including critical care time)  Labs Reviewed - No data to display Ct Head Wo Contrast  08/22/2012   *RADIOLOGY REPORT*  Clinical Data: Fall, injury, facial trauma  CT HEAD WITHOUT CONTRAST  Technique:  Contiguous axial images were obtained from the base of the skull through the vertex without contrast.  Comparison: 06/01/2012  Findings: Diffuse brain atrophy with extensive chronic microvascular ischemic changes of white matter.  No acute intracranial hemorrhage, mass lesion, infarction, midline shift, herniation, or extra-axial fluid collection.  No focal mass  effect or edema.  Cisterns patent.  Cerebellar atrophy as well.  Symmetric orbits.  Mastoids are clear.  Minor right maxillary sinus mucosal thickening with a small air-fluid level.  Other  sinuses clear.  No depressed skull fracture.  IMPRESSION: Stable atrophy and chronic microvascular ischemic changes.  No acute intracranial process  Right maxillary sinus disease.   Original Report Authenticated By: Judie Petit. Miles Costain, M.D.   1. Fall, initial encounter   2. Abrasion of nose, initial encounter   3. Abrasion of knee, right, initial encounter     MDM  CT neg, pt remains awake and alert. Return to [facility] with local wound care.   Danaysia Rader B. Bernette Mayers, MD 08/22/12 [phone]

## 2012-08-22 NOTE — ED Notes (Signed)
Bed: ZO10 Expected date:  Expected time:  Means of arrival:  Comments: Elderly fall, no thinners

## 2012-08-22 NOTE — ED Notes (Signed)
Per EMS, Pt, from [facility], c/o mild nose pain and bilateral hand pain after a fall.  Pain score 1/10.  Pt sts "I dropped something and fell when I bent over to pick it up."  No hand deformity noted.  Abrasion and swelling noted to nose.  Denies LOC.  CBG 102 and vitals WDL.  A & Ox4.

## 2012-08-22 NOTE — ED Notes (Signed)
Pt sts "I fell forward out of my wheelchair onto cement."

## 2012-08-22 NOTE — ED Notes (Signed)
Patient transported to CT 

## 2012-08-22 NOTE — ED Notes (Addendum)
Pt was offered a sandwich and refused.

## 2012-09-13 ENCOUNTER — Encounter (HOSPITAL_COMMUNITY): Payer: Self-pay | Admitting: Emergency Medicine

## 2012-09-13 ENCOUNTER — Emergency Department (HOSPITAL_COMMUNITY)
Admission: EM | Admit: 2012-09-13 | Discharge: 2012-09-13 | Disposition: A | Discharge status: Skilled Nursing Facility | Payer: Medicare Other | Attending: Emergency Medicine | Admitting: Emergency Medicine

## 2012-09-13 DIAGNOSIS — E876 Hypokalemia: Secondary | ICD-10-CM | POA: Insufficient documentation

## 2012-09-13 DIAGNOSIS — Z7982 Long term (current) use of aspirin: Secondary | ICD-10-CM | POA: Insufficient documentation

## 2012-09-13 DIAGNOSIS — I10 Essential (primary) hypertension: Secondary | ICD-10-CM | POA: Insufficient documentation

## 2012-09-13 DIAGNOSIS — E785 Hyperlipidemia, unspecified: Secondary | ICD-10-CM | POA: Insufficient documentation

## 2012-09-13 DIAGNOSIS — E119 Type 2 diabetes mellitus without complications: Secondary | ICD-10-CM | POA: Insufficient documentation

## 2012-09-13 DIAGNOSIS — F17200 Nicotine dependence, unspecified, uncomplicated: Secondary | ICD-10-CM | POA: Insufficient documentation

## 2012-09-13 DIAGNOSIS — G4733 Obstructive sleep apnea (adult) (pediatric): Secondary | ICD-10-CM | POA: Insufficient documentation

## 2012-09-13 DIAGNOSIS — N390 Urinary tract infection, site not specified: Secondary | ICD-10-CM | POA: Insufficient documentation

## 2012-09-13 DIAGNOSIS — Z794 Long term (current) use of insulin: Secondary | ICD-10-CM | POA: Insufficient documentation

## 2012-09-13 DIAGNOSIS — E871 Hypo-osmolality and hyponatremia: Secondary | ICD-10-CM

## 2012-09-13 DIAGNOSIS — Z79899 Other long term (current) drug therapy: Secondary | ICD-10-CM | POA: Insufficient documentation

## 2012-09-13 DIAGNOSIS — J449 Chronic obstructive pulmonary disease, unspecified: Secondary | ICD-10-CM | POA: Insufficient documentation

## 2012-09-13 DIAGNOSIS — J4489 Other specified chronic obstructive pulmonary disease: Secondary | ICD-10-CM | POA: Insufficient documentation

## 2012-09-13 DIAGNOSIS — Z8679 Personal history of other diseases of the circulatory system: Secondary | ICD-10-CM | POA: Insufficient documentation

## 2012-09-13 LAB — BASIC METABOLIC PANEL
BUN: 11 mg/dL (ref 6–23)
CO2: 25 mEq/L (ref 19–32)
Calcium: 9.9 mg/dL (ref 8.4–10.5)
Chloride: 91 mEq/L — ABNORMAL LOW (ref 96–112)
Creatinine, Ser: 0.55 mg/dL (ref 0.50–1.10)
GFR calc Af Amer: >90 mL/min (ref >90)
GFR calc non Af Amer: >90 mL/min (ref >90)
Glucose, Bld: 90 mg/dL (ref 70–99)
Potassium: 4.6 mEq/L (ref 3.5–5.1)
Sodium: 126 mEq/L — ABNORMAL LOW (ref 135–145)

## 2012-09-13 LAB — CBC WITH DIFFERENTIAL/PLATELET
Basophils Absolute: 0 10*3/uL (ref 0.0–0.1)
Basophils Relative: 0 % (ref 0–1)
Eosinophils Absolute: 0.3 10*3/uL (ref 0.0–0.7)
Eosinophils Relative: 2 % (ref 0–5)
HCT: 40.2 % (ref 36.0–46.0)
Hemoglobin: 13.5 g/dL (ref 12.0–15.0)
Lymphocytes Relative: 16 % (ref 12–46)
Lymphs Abs: 2.4 10*3/uL (ref 0.7–4.0)
MCH: 30.3 pg (ref 26.0–34.0)
MCHC: 33.6 g/dL (ref 30.0–36.0)
MCV: 90.1 fL (ref 78.0–100.0)
Monocytes Absolute: 0.9 10*3/uL (ref 0.1–1.0)
Monocytes Relative: 6 % (ref 3–12)
Neutro Abs: 11.8 10*3/uL — ABNORMAL HIGH (ref 1.7–7.7)
Neutrophils Relative %: 76 % (ref 43–77)
Platelets: 281 10*3/uL (ref 150–400)
RBC: 4.46 MIL/uL (ref 3.87–5.11)
RDW: 15.1 % (ref 11.5–15.5)
WBC: 15.4 10*3/uL — ABNORMAL HIGH (ref 4.0–10.5)

## 2012-09-13 LAB — URINALYSIS, ROUTINE W REFLEX MICROSCOPIC
Bilirubin Urine: NEGATIVE
Glucose, UA: NEGATIVE mg/dL
Hgb urine dipstick: NEGATIVE
Ketones, ur: NEGATIVE mg/dL
Nitrite: POSITIVE — AB
Protein, ur: NEGATIVE mg/dL
Specific Gravity, Urine: 1.014 (ref 1.005–1.030)
Urobilinogen, UA: 0.2 mg/dL (ref 0.0–1.0)
pH: 6.5 (ref 5.0–8.0)

## 2012-09-13 LAB — URINE MICROSCOPIC-ADD ON

## 2012-09-13 MED ORDER — CEFTRIAXONE SODIUM 1 G IJ SOLR
1.0000 g | INTRAMUSCULAR | Status: DC
  Administered 2012-09-13: 1 g via INTRAVENOUS
  Filled 2012-09-13: qty 10

## 2012-09-13 MED ORDER — SODIUM CHLORIDE 0.9 % IV BOLUS (SEPSIS)
1000.0000 mL | Freq: Once | INTRAVENOUS | Status: AC
  Administered 2012-09-13: 1000 mL via INTRAVENOUS

## 2012-09-13 MED ORDER — ONDANSETRON HCL 4 MG/2ML IJ SOLN
4.0000 mg | Freq: Once | INTRAMUSCULAR | Status: AC
  Administered 2012-09-13: 4 mg via INTRAVENOUS
  Filled 2012-09-13: qty 2

## 2012-09-13 MED ORDER — CEPHALEXIN 500 MG PO CAPS: 500.0000 mg | ORAL_CAPSULE | Freq: Four times a day (QID) | ORAL | Status: DC

## 2012-09-13 NOTE — ED Provider Notes (Signed)
CSN: [id number]     Arrival date & time 09/13/12  1331 History   First MD Initiated Contact with Patient 09/13/12 1500     Chief Complaint  Patient presents with  . Nausea  . Emesis   (Consider location/radiation/quality/duration/timing/severity/associated sxs/prior Treatment) HPI  72yF sent for evaluation of confusion. Unclear onset. No reported trauma. No fever. No recent medication changes the best I can tell. Pt with no complaints. She denies pain anywhere. No n/v. No fever. No urinary complaints. I feel she is an unreliable historian though.   Past Medical History  Diagnosis Date  . COPD (chronic obstructive pulmonary disease)   . Diabetes mellitus   . Hypertension   . Urinary incontinence   . Stasis dermatitis   . Hyperlipidemia   . Obstructive sleep apnea     Patient denies  . Morbid obesity    Past Surgical History  Procedure Laterality Date  . Carpal tunnel release      Patient denies  . Joint replacement      Knee   Family History  Problem Relation Age of Onset  . Esophageal cancer Brother   . Cirrhosis Mother    History  Substance Use Topics  . Smoking status: Current Every Day Smoker    Types: Cigarettes  . Smokeless tobacco: Never Used  . Alcohol Use: No   OB History   Grav Para Term Preterm Abortions TAB SAB Ect Mult Living                 Review of Systems  Level 5 caveat applies because pt is poor historian.  Allergies  Codeine and Ciprofloxacin  Home Medications   Current Outpatient Rx  Name  Route  Sig  Dispense  Refill  . acetaminophen (TYLENOL) 500 MG tablet   Oral   Take 500 mg by mouth every 4 (four) hours as needed. For pain. **Max 4 tab in 24 hrs.**         . acidophilus (RISAQUAD) CAPS   Oral   Take 2 capsules by mouth daily.         Marland Kitchen albuterol (PROAIR HFA) 108 (90 BASE) MCG/ACT inhaler   Inhalation   Inhale 2 puffs into the lungs every 4 (four) hours as needed. For shortness of breath/wheezing         . aspirin  EC 81 MG tablet   Oral   Take 81 mg by mouth daily.         . cholecalciferol (VITAMIN D) 1000 UNITS tablet   Oral   Take 1,000 Units by mouth daily.           . citalopram (CELEXA) 20 MG tablet   Oral   Take 20 mg by mouth at bedtime.          . Cranberry 425 MG CAPS   Oral   Take 1 capsule by mouth 2 (two) times daily.         . famotidine (PEPCID) 40 MG tablet   Oral   Take 40 mg by mouth daily.           . ferrous sulfate 325 (65 FE) MG tablet   Oral   Take 325 mg by mouth daily.         . hydrochlorothiazide (HYDRODIURIL) 25 MG tablet   Oral   Take 25 mg by mouth daily.           . insulin aspart (NOVOLOG FLEXPEN) 100 UNIT/ML injection   Subcutaneous  Inject 5 Units into the skin 3 (three) times daily before meals. 5 UNITS IF SUGARS ARE OVER 200         . insulin detemir (LEVEMIR) 100 UNIT/ML injection   Subcutaneous   Inject 40 Units into the skin 2 (two) times daily.         Marland Kitchen levalbuterol (XOPENEX) 1.25 MG/3ML nebulizer solution   Nebulization   Take 1.25 mg by nebulization every 4 (four) hours as needed. For shortness of breath/wheezing.         Marland Kitchen lisinopril (PRINIVIL,ZESTRIL) 5 MG tablet   Oral   Take 5 mg by mouth daily.         Marland Kitchen loperamide (IMODIUM) 2 MG capsule   Oral   Take 2 mg by mouth 4 (four) times daily as needed for diarrhea or loose stools.         . Melatonin 3 MG CAPS   Oral   Take 1 capsule by mouth at bedtime as needed. For sleep.         . metFORMIN (GLUCOPHAGE) 500 MG tablet   Oral   Take 1,000 mg by mouth 2 (two) times daily with a meal.          . simvastatin (ZOCOR) 10 MG tablet   Oral   Take 10 mg by mouth at bedtime.         . sulfamethoxazole-trimethoprim (BACTRIM DS) 800-160 MG per tablet   Oral   Take 1 tablet by mouth 2 (two) times daily.          BP 108/42  Pulse 71  Temp(Src) 98.2 F (36.8 C) (Oral)  Resp 16  SpO2 94% Physical Exam  Nursing note and vitals  reviewed. Constitutional: She appears well-developed and well-nourished. No distress.  HENT:  Head: Normocephalic and atraumatic.  Eyes: Conjunctivae are normal. Right eye exhibits no discharge. Left eye exhibits no discharge.  Neck: Neck supple.  Cardiovascular: Normal rate, regular rhythm and normal heart sounds.  Exam reveals no gallop and no friction rub.   No murmur heard. Pulmonary/Chest: Effort normal and breath sounds normal. No respiratory distress.  Abdominal: Soft. She exhibits no distension. There is no tenderness.  Musculoskeletal: She exhibits no edema and no tenderness.  Neurological: She is alert. No cranial nerve deficit. She exhibits normal muscle tone. Coordination normal.  Skin: Skin is warm and dry.  Psychiatric: She has a normal mood and affect. Her behavior is normal. Thought content normal.    ED Course  Procedures (including critical care time) Labs Review Labs Reviewed  URINALYSIS, ROUTINE W REFLEX MICROSCOPIC - Abnormal; Notable for the following:    Nitrite POSITIVE (*)    Leukocytes, UA SMALL (*)    All other components within normal limits  CBC WITH DIFFERENTIAL - Abnormal; Notable for the following:    WBC 15.4 (*)    Neutro Abs 11.8 (*)    All other components within normal limits  BASIC METABOLIC PANEL - Abnormal; Notable for the following:    Sodium 126 (*)    Chloride 91 (*)    All other components within normal limits  URINE MICROSCOPIC-ADD ON - Abnormal; Notable for the following:    Squamous Epithelial / LPF FEW (*)    Bacteria, UA FEW (*)    All other components within normal limits   Imaging Review No results found.  MDM   1. UTI (urinary tract infection)   2. Hyponatremia     72yF sent in for evaluation  of confusion. W/u significant for UTI and hyponatremia. Either/both could be causing symptoms. She has a nonfocal neuro exam and she has no acute complaints. Mild hypotension noted, but consistent with prior evaluations (below).  Given dose of rocephin and script for keflex. On HCTZ. With hyponatremia and BPs in this range, will have hold until can discuss further with PCP.   Vitals - 1 value per visit 09/13/2012 08/22/2012 08/10/2012 08/05/2012 06/01/2012  SYSTOLIC 108 124 161 114 143  DIASTOLIC 42 52 40 31 46   Vitals - 1 value per visit 02/08/2012 12/08/2010  SYSTOLIC 138 122  DIASTOLIC 50 68    Raeford Razor, MD 09/18/12 [phone]

## 2012-09-13 NOTE — ED Notes (Signed)
Bed: VH84 Expected date:  Expected time:  Means of arrival:  Comments: ems- n/v

## 2012-09-13 NOTE — ED Notes (Signed)
Patient reports no nausea or abdominal pain at this time.  Patient is taking an antibiotic for UTI and says they always make her sick to her stomach.

## 2012-09-13 NOTE — ED Notes (Signed)
Per EMS: staff states pt is not herself, has had n/v this morning. Pt is being treated for a UTI at the the time.

## 2012-09-27 ENCOUNTER — Emergency Department (HOSPITAL_COMMUNITY)
Admission: EM | Admit: 2012-09-27 | Discharge: 2012-09-27 | Disposition: A | Discharge status: Home / Self Care | Payer: Medicare Other | Attending: Emergency Medicine | Admitting: Emergency Medicine

## 2012-09-27 ENCOUNTER — Encounter (HOSPITAL_COMMUNITY): Payer: Self-pay | Admitting: Emergency Medicine

## 2012-09-27 DIAGNOSIS — E119 Type 2 diabetes mellitus without complications: Secondary | ICD-10-CM | POA: Insufficient documentation

## 2012-09-27 DIAGNOSIS — Z7982 Long term (current) use of aspirin: Secondary | ICD-10-CM | POA: Insufficient documentation

## 2012-09-27 DIAGNOSIS — Y939 Activity, unspecified: Secondary | ICD-10-CM | POA: Insufficient documentation

## 2012-09-27 DIAGNOSIS — Y9289 Other specified places as the place of occurrence of the external cause: Secondary | ICD-10-CM | POA: Insufficient documentation

## 2012-09-27 DIAGNOSIS — Z794 Long term (current) use of insulin: Secondary | ICD-10-CM | POA: Insufficient documentation

## 2012-09-27 DIAGNOSIS — S0003XA Contusion of scalp, initial encounter: Secondary | ICD-10-CM | POA: Insufficient documentation

## 2012-09-27 DIAGNOSIS — Z79899 Other long term (current) drug therapy: Secondary | ICD-10-CM | POA: Insufficient documentation

## 2012-09-27 DIAGNOSIS — W1809XA Striking against other object with subsequent fall, initial encounter: Secondary | ICD-10-CM | POA: Insufficient documentation

## 2012-09-27 DIAGNOSIS — E785 Hyperlipidemia, unspecified: Secondary | ICD-10-CM | POA: Insufficient documentation

## 2012-09-27 DIAGNOSIS — I10 Essential (primary) hypertension: Secondary | ICD-10-CM | POA: Insufficient documentation

## 2012-09-27 DIAGNOSIS — F17200 Nicotine dependence, unspecified, uncomplicated: Secondary | ICD-10-CM | POA: Insufficient documentation

## 2012-09-27 NOTE — ED Notes (Signed)
Bed: ZO10 Expected date: 09/27/12 Expected time: 6:43 PM Means of arrival: Ambulance Comments: fall

## 2012-09-27 NOTE — ED Notes (Signed)
Pt stated that they were charging her w/c and she got up and tripped on the cords

## 2012-09-27 NOTE — ED Notes (Signed)
Per EMS pt from assisted living, pt fell twice, last night time she fell hit her R knee (abrasion), R shoulder, then hit head (back), pt stood up w/ assistance as she normally would, pt states R shoulder is sore. BP 124/54, RR 14, HR 70, a/o x 4

## 2012-09-27 NOTE — ED Provider Notes (Signed)
CSN: [id number]     Arrival date & time 09/27/12  1853 History   First MD Initiated Contact with Patient 09/27/12 1902     Chief Complaint  Patient presents with  . Fall  . Head Injury   ) HPI  Patient presents after being transferred from her assisted living facility. GEN fall yesterday. She describes in great detail the chair power chair was being charged for bed. She got up she caught her feet under the cord. She turned and fell. She landed on her buttocks. She hit her head on the carpet. She has no complaints. She states she had told them about the fall today, and they were concerned, and want her to be evaluated. She has no complaints of her extremities. She has no complaints of her head. She put him in the back of her head and states "there's a little bump. She is not anticoagulated.  Past Medical History  Diagnosis Date  . COPD (chronic obstructive pulmonary disease)   . Diabetes mellitus   . Hypertension   . Urinary incontinence   . Stasis dermatitis   . Hyperlipidemia   . Obstructive sleep apnea     Patient denies  . Morbid obesity    Past Surgical History  Procedure Laterality Date  . Carpal tunnel release      Patient denies  . Joint replacement      Knee   Family History  Problem Relation Age of Onset  . Esophageal cancer Brother   . Cirrhosis Mother    History  Substance Use Topics  . Smoking status: Current Every Day Smoker    Types: Cigarettes  . Smokeless tobacco: Never Used  . Alcohol Use: No   OB History   Grav Para Term Preterm Abortions TAB SAB Ect Mult Living                 Review of Systems  Constitutional: Negative for fever, chills, diaphoresis, appetite change and fatigue.  HENT: Negative for sore throat, mouth sores and trouble swallowing.   Eyes: Negative for visual disturbance.  Respiratory: Negative for cough, chest tightness, shortness of breath and wheezing.   Cardiovascular: Negative for chest pain.  Gastrointestinal: Negative for  nausea, vomiting, abdominal pain, diarrhea and abdominal distention.  Endocrine: Negative for polydipsia, polyphagia and polyuria.  Genitourinary: Negative for dysuria, frequency and hematuria.  Musculoskeletal: Negative for gait problem.  Skin: Negative for color change, pallor and rash.  Neurological: Negative for dizziness, syncope, light-headedness and headaches.  Hematological: Does not bruise/bleed easily.  Psychiatric/Behavioral: Negative for behavioral problems and confusion.   patient's review of systems is complete and normal she has no complaints  Allergies  Codeine and Ciprofloxacin  Home Medications   Current Outpatient Rx  Name  Route  Sig  Dispense  Refill  . acetaminophen (TYLENOL) 500 MG tablet   Oral   Take 500 mg by mouth every 4 (four) hours as needed. For pain. **Max 4 tab in 24 hrs.**         . acidophilus (RISAQUAD) CAPS   Oral   Take 2 capsules by mouth daily.         Marland Kitchen albuterol (PROAIR HFA) 108 (90 BASE) MCG/ACT inhaler   Inhalation   Inhale 2 puffs into the lungs every 4 (four) hours as needed. For shortness of breath/wheezing         . aspirin EC 81 MG tablet   Oral   Take 81 mg by mouth daily.         Marland Kitchen  cholecalciferol (VITAMIN D) 1000 UNITS tablet   Oral   Take 1,000 Units by mouth daily.           . citalopram (CELEXA) 20 MG tablet   Oral   Take 20 mg by mouth at bedtime.          . Cranberry 425 MG CAPS   Oral   Take 1 capsule by mouth 2 (two) times daily.         . famotidine (PEPCID) 40 MG tablet   Oral   Take 40 mg by mouth daily.           . hydrochlorothiazide (HYDRODIURIL) 25 MG tablet   Oral   Take 25 mg by mouth daily.           . insulin aspart (NOVOLOG FLEXPEN) 100 UNIT/ML injection   Subcutaneous   Inject 5 Units into the skin 3 (three) times daily before meals. 5 UNITS IF SUGARS ARE OVER 200         . insulin detemir (LEVEMIR) 100 UNIT/ML injection   Subcutaneous   Inject 40 Units into the skin  2 (two) times daily.         Marland Kitchen levalbuterol (XOPENEX) 1.25 MG/3ML nebulizer solution   Nebulization   Take 1.25 mg by nebulization every 4 (four) hours as needed. For shortness of breath/wheezing.         Marland Kitchen lisinopril (PRINIVIL,ZESTRIL) 5 MG tablet   Oral   Take 5 mg by mouth daily.         Marland Kitchen loperamide (IMODIUM) 2 MG capsule   Oral   Take 2 mg by mouth 4 (four) times daily as needed for diarrhea or loose stools.         . Melatonin 3 MG CAPS   Oral   Take 1 capsule by mouth at bedtime as needed. For sleep.         . metFORMIN (GLUCOPHAGE) 500 MG tablet   Oral   Take 1,000 mg by mouth 2 (two) times daily with a meal.          . simvastatin (ZOCOR) 10 MG tablet   Oral   Take 10 mg by mouth at bedtime.          BP 115/52  Pulse 65  Temp(Src) 97.9 F (36.6 C) (Oral)  Resp 18  Ht 5\' 2"  (1.575 m)  Wt 179 lb (81.194 kg)  BMI 32.73 kg/m2  SpO2 91% Physical Exam  Constitutional: She is oriented to person, place, and time. She appears well-developed and well-nourished. No distress.  HENT:  Head: Normocephalic.  Exam the back of her head shows no contusion of soft tissue swelling. She states she is "a little sore" in the back right of her head. No blood collection of the TMs, mastoids, or from ears nose or mouth. Nontender in the neck and spine  Eyes: Conjunctivae are normal. Pupils are equal, round, and reactive to light. No scleral icterus.  Neck: Normal range of motion. Neck supple. No thyromegaly present.  Cardiovascular: Normal rate and regular rhythm.  Exam reveals no gallop and no friction rub.   No murmur heard. Pulmonary/Chest: Effort normal and breath sounds normal. No respiratory distress. She has no wheezes. She has no rales.  Abdominal: Soft. Bowel sounds are normal. She exhibits no distension. There is no tenderness. There is no rebound.  Musculoskeletal: Normal range of motion.  Range of motion of the shoulders elbows knees hips wrist. No pain of any  type over the bony prominences of her  extremities  Neurological: She is alert and oriented to person, place, and time.  Skin: Skin is warm and dry. No rash noted.  Psychiatric: She has a normal mood and affect. Her behavior is normal.    ED Course  Procedures (including critical care time) Labs Review Labs Reviewed - No data to display Imaging Review No results found.  MDM   1. Scalp contusion, initial encounter    His normal exam. She has no complaints. Doesn't feel any imaging or testing is necessary at this point she appears to be injury free after her fall.    Roney Marion, MD 09/27/12 2004

## 2013-01-01 ENCOUNTER — Encounter (HOSPITAL_COMMUNITY): Payer: Self-pay | Admitting: Emergency Medicine

## 2013-01-01 ENCOUNTER — Emergency Department (HOSPITAL_COMMUNITY)
Admission: EM | Admit: 2013-01-01 | Discharge: 2013-01-01 | Disposition: A | Discharge status: Skilled Nursing Facility | Payer: Medicare Other | Attending: Emergency Medicine | Admitting: Emergency Medicine

## 2013-01-01 DIAGNOSIS — E119 Type 2 diabetes mellitus without complications: Secondary | ICD-10-CM | POA: Insufficient documentation

## 2013-01-01 DIAGNOSIS — Z7982 Long term (current) use of aspirin: Secondary | ICD-10-CM | POA: Insufficient documentation

## 2013-01-01 DIAGNOSIS — Z8669 Personal history of other diseases of the nervous system and sense organs: Secondary | ICD-10-CM | POA: Insufficient documentation

## 2013-01-01 DIAGNOSIS — Z79899 Other long term (current) drug therapy: Secondary | ICD-10-CM | POA: Insufficient documentation

## 2013-01-01 DIAGNOSIS — Z872 Personal history of diseases of the skin and subcutaneous tissue: Secondary | ICD-10-CM | POA: Insufficient documentation

## 2013-01-01 DIAGNOSIS — Z794 Long term (current) use of insulin: Secondary | ICD-10-CM | POA: Insufficient documentation

## 2013-01-01 DIAGNOSIS — N390 Urinary tract infection, site not specified: Secondary | ICD-10-CM | POA: Insufficient documentation

## 2013-01-01 DIAGNOSIS — I10 Essential (primary) hypertension: Secondary | ICD-10-CM | POA: Insufficient documentation

## 2013-01-01 DIAGNOSIS — J441 Chronic obstructive pulmonary disease with (acute) exacerbation: Secondary | ICD-10-CM | POA: Insufficient documentation

## 2013-01-01 DIAGNOSIS — E785 Hyperlipidemia, unspecified: Secondary | ICD-10-CM | POA: Insufficient documentation

## 2013-01-01 DIAGNOSIS — F17200 Nicotine dependence, unspecified, uncomplicated: Secondary | ICD-10-CM | POA: Insufficient documentation

## 2013-01-01 DIAGNOSIS — Z792 Long term (current) use of antibiotics: Secondary | ICD-10-CM | POA: Insufficient documentation

## 2013-01-01 LAB — URINALYSIS, ROUTINE W REFLEX MICROSCOPIC
Ketones, ur: NEGATIVE mg/dL
Nitrite: POSITIVE — AB
Specific Gravity, Urine: 1.009 (ref 1.005–1.030)
pH: 6.5 (ref 5.0–8.0)

## 2013-01-01 LAB — BASIC METABOLIC PANEL
CO2: 26 mEq/L (ref 19–32)
Chloride: 95 mEq/L — ABNORMAL LOW (ref 96–112)
GFR calc Af Amer: >90 mL/min (ref >90)
Potassium: 3 mEq/L — ABNORMAL LOW (ref 3.5–5.1)
Sodium: 131 mEq/L — ABNORMAL LOW (ref 135–145)

## 2013-01-01 LAB — CBC WITH DIFFERENTIAL/PLATELET
Basophils Absolute: 0 10*3/uL (ref 0.0–0.1)
Basophils Relative: 0 % (ref 0–1)
Lymphocytes Relative: 18 % (ref 12–46)
MCHC: 32.4 g/dL (ref 30.0–36.0)
Neutro Abs: 8.3 10*3/uL — ABNORMAL HIGH (ref 1.7–7.7)
Neutrophils Relative %: 71 % (ref 43–77)
RDW: 14.9 % (ref 11.5–15.5)
WBC: 11.8 10*3/uL — ABNORMAL HIGH (ref 4.0–10.5)

## 2013-01-01 LAB — URINE MICROSCOPIC-ADD ON

## 2013-01-01 MED ORDER — CEPHALEXIN 500 MG PO CAPS
500.0000 mg | ORAL_CAPSULE | Freq: Once | ORAL | Status: AC
  Administered 2013-01-01: 500 mg via ORAL
  Filled 2013-01-01: qty 1

## 2013-01-01 MED ORDER — CEPHALEXIN 500 MG PO CAPS: ORAL_CAPSULE | ORAL | Status: DC
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
## 2013-01-01 NOTE — ED Notes (Signed)
[facility] called for transport.  
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
## 2013-01-04 LAB — URINE CULTURE

## 2013-01-05 ENCOUNTER — Telehealth (HOSPITAL_COMMUNITY): Payer: Self-pay | Admitting: Emergency Medicine

## 2013-01-05 NOTE — ED Notes (Signed)
Post ED Visit - Positive Culture Follow-up  Culture report reviewed by antimicrobial stewardship pharmacist: []  Marlou Sa, Pharm.D., BCPS []  Celedonio Miyamoto, Pharm.D., BCPS []  Georgina Pillion, [address].D., BCPS []  [place], [address].D., BCPS, AAHIVP [x]  Estella Husk, Pharm.D., BCPS, AAHIVP  Positive urine culture Per pharmacist, fax results to [facility].  Zeb Comfort 01/05/2013, 12:59 PM

## 2013-02-20 ENCOUNTER — Encounter (HOSPITAL_COMMUNITY): Payer: Self-pay | Admitting: Emergency Medicine

## 2013-02-20 ENCOUNTER — Emergency Department (HOSPITAL_COMMUNITY)
Admission: EM | Admit: 2013-02-20 | Discharge: 2013-02-20 | Disposition: A | Discharge status: Home / Self Care | Payer: Medicare Other | Attending: Emergency Medicine | Admitting: Emergency Medicine

## 2013-02-20 DIAGNOSIS — Z7982 Long term (current) use of aspirin: Secondary | ICD-10-CM | POA: Insufficient documentation

## 2013-02-20 DIAGNOSIS — N390 Urinary tract infection, site not specified: Secondary | ICD-10-CM | POA: Insufficient documentation

## 2013-02-20 DIAGNOSIS — E785 Hyperlipidemia, unspecified: Secondary | ICD-10-CM | POA: Insufficient documentation

## 2013-02-20 DIAGNOSIS — I10 Essential (primary) hypertension: Secondary | ICD-10-CM | POA: Insufficient documentation

## 2013-02-20 DIAGNOSIS — Y9210 Unspecified residential institution as the place of occurrence of the external cause: Secondary | ICD-10-CM | POA: Insufficient documentation

## 2013-02-20 DIAGNOSIS — J449 Chronic obstructive pulmonary disease, unspecified: Secondary | ICD-10-CM | POA: Insufficient documentation

## 2013-02-20 DIAGNOSIS — J4489 Other specified chronic obstructive pulmonary disease: Secondary | ICD-10-CM | POA: Insufficient documentation

## 2013-02-20 DIAGNOSIS — Z794 Long term (current) use of insulin: Secondary | ICD-10-CM | POA: Insufficient documentation

## 2013-02-20 DIAGNOSIS — Y9389 Activity, other specified: Secondary | ICD-10-CM | POA: Insufficient documentation

## 2013-02-20 DIAGNOSIS — E119 Type 2 diabetes mellitus without complications: Secondary | ICD-10-CM | POA: Insufficient documentation

## 2013-02-20 DIAGNOSIS — Z8669 Personal history of other diseases of the nervous system and sense organs: Secondary | ICD-10-CM | POA: Insufficient documentation

## 2013-02-20 DIAGNOSIS — F17200 Nicotine dependence, unspecified, uncomplicated: Secondary | ICD-10-CM | POA: Insufficient documentation

## 2013-02-20 DIAGNOSIS — Z79899 Other long term (current) drug therapy: Secondary | ICD-10-CM | POA: Insufficient documentation

## 2013-02-20 DIAGNOSIS — W050XXA Fall from non-moving wheelchair, initial encounter: Secondary | ICD-10-CM | POA: Insufficient documentation

## 2013-02-20 DIAGNOSIS — Z043 Encounter for examination and observation following other accident: Secondary | ICD-10-CM | POA: Insufficient documentation

## 2013-02-20 DIAGNOSIS — W19XXXA Unspecified fall, initial encounter: Secondary | ICD-10-CM

## 2013-02-20 LAB — URINALYSIS, ROUTINE W REFLEX MICROSCOPIC
Bilirubin Urine: NEGATIVE
GLUCOSE, UA: NEGATIVE mg/dL
HGB URINE DIPSTICK: NEGATIVE
KETONES UR: NEGATIVE mg/dL
Nitrite: POSITIVE — AB
PROTEIN: NEGATIVE mg/dL
Specific Gravity, Urine: 1.009 (ref 1.005–1.030)
UROBILINOGEN UA: 0.2 mg/dL (ref 0.0–1.0)
pH: 7.5 (ref 5.0–8.0)

## 2013-02-20 LAB — URINE MICROSCOPIC-ADD ON

## 2013-02-20 MED ORDER — NITROFURANTOIN MONOHYD MACRO 100 MG PO CAPS
100.0000 mg | ORAL_CAPSULE | Freq: Two times a day (BID) | ORAL | Status: AC
Start: 2013-02-20 — End: ?

## 2013-02-20 NOTE — ED Notes (Signed)
Pt given meal tray.  Pt to d/c home via [facility].
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
## 2013-02-20 NOTE — Discharge Instructions (Signed)
Fall Prevention and Home Safety Falls cause injuries and can affect all age groups. It is possible to use preventive measures to significantly decrease the likelihood of falls. There are many simple measures which can make your home safer and prevent falls. OUTDOORS  Repair cracks and edges of walkways and driveways.  Remove high doorway thresholds.  Trim shrubbery on the main path into your home.  Have good outside lighting.  Clear walkways of tools, rocks, debris, and clutter.  Check that handrails are not broken and are securely fastened. Both sides of steps should have handrails.  Have leaves, snow, and ice cleared regularly.  Use sand or salt on walkways during winter months.  In the garage, clean up grease or oil spills. BATHROOM  Install night lights.  Install grab bars by the toilet and in the tub and shower.  Use non-skid mats or decals in the tub or shower.  Place a plastic non-slip stool in the shower to sit on, if needed.  Keep floors dry and clean up all water on the floor immediately.  Remove soap buildup in the tub or shower on a regular basis.  Secure bath mats with non-slip, double-sided rug tape.  Remove throw rugs and tripping hazards from the floors. BEDROOMS  Install night lights.  Make sure a bedside light is easy to reach.  Do not use oversized bedding.  Keep a telephone by your bedside.  Have a firm chair with side arms to use for getting dressed.  Remove throw rugs and tripping hazards from the floor. KITCHEN  Keep handles on pots and pans turned toward the center of the stove. Use back burners when possible.  Clean up spills quickly and allow time for drying.  Avoid walking on wet floors.  Avoid hot utensils and knives.  Position shelves so they are not too high or low.  Place commonly used objects within easy reach.  If necessary, use a sturdy step stool with a grab bar when reaching.  Keep electrical cables out of the  way.  Do not use floor polish or wax that makes floors slippery. If you must use wax, use non-skid floor wax.  Remove throw rugs and tripping hazards from the floor. STAIRWAYS  Never leave objects on stairs.  Place handrails on both sides of stairways and use them. Fix any loose handrails. Make sure handrails on both sides of the stairways are as long as the stairs.  Check carpeting to make sure it is firmly attached along stairs. Make repairs to worn or loose carpet promptly.  Avoid placing throw rugs at the top or bottom of stairways, or properly secure the rug with carpet tape to prevent slippage. Get rid of throw rugs, if possible.  Have an electrician put in a light switch at the top and bottom of the stairs. OTHER FALL PREVENTION TIPS  Wear low-heel or rubber-soled shoes that are supportive and fit well. Wear closed toe shoes.  When using a stepladder, make sure it is fully opened and both spreaders are firmly locked. Do not climb a closed stepladder.  Add color or contrast paint or tape to grab bars and handrails in your home. Place contrasting color strips on first and last steps.  Learn and use mobility aids as needed. Install an electrical emergency response system.  Turn on lights to avoid dark areas. Replace light bulbs that burn out immediately. Get light switches that glow.  Arrange furniture to create clear pathways. Keep furniture in the same place.  Firmly attach carpet with non-skid or double-sided tape.  Eliminate uneven floor surfaces.  Select a carpet pattern that does not visually hide the edge of steps.  Be aware of all pets. OTHER HOME SAFETY TIPS  Set the water temperature for 120 F (48.8 C).  Keep emergency numbers on or near the telephone.  Keep smoke detectors on every level of the home and near sleeping areas. Document Released: 12/17/2001 Document Revised: 06/28/2011 Document Reviewed: 03/18/2011 [facility] Patient Information 2014  [place], [place].  Urinary Tract Infection A urinary tract infection (UTI) can occur any place along the urinary tract. The tract includes the kidneys, ureters, bladder, and urethra. A type of germ called bacteria often causes a UTI. UTIs are often helped with antibiotic medicine.  HOME CARE   If given, take antibiotics as told by your doctor. Finish them even if you start to feel better.  Drink enough fluids to keep your pee (urine) clear or pale yellow.  Avoid tea, drinks with caffeine, and bubbly (carbonated) drinks.  Pee often. Avoid holding your pee in for a long time.  Pee before and after having sex (intercourse).  Wipe from front to back after you poop (bowel movement) if you are a woman. Use each tissue only once. GET HELP RIGHT AWAY IF:   You have back pain.  You have lower belly (abdominal) pain.  You have chills.  You feel sick to your stomach (nauseous).  You throw up (vomit).  Your burning or discomfort with peeing does not go away.  You have a fever.  Your symptoms are not better in 3 days. MAKE SURE YOU:   Understand these instructions.  Will watch your condition.  Will get help right away if you are not doing well or get worse. Document Released: 06/15/2007 Document Revised: 09/21/2011 Document Reviewed: 07/28/2011 [facility] Patient Information 2014 [place], [place].

## 2013-02-20 NOTE — ED Notes (Signed)
Per EMS, [facility]  Pt fell out of electric chair at around 7am.  Pt fell to ground landing on left side.  Pt states did not strike head.  Fall witnessed.  Pt c/o no pain.  No LOC.  Pt was reaching for a glove when she fell.  Vitals:  150/70, hr 80, resp 16, cbg 94

## 2013-02-20 NOTE — ED Notes (Signed)
Bed: WA14 Expected date:  Expected time:  Means of arrival:  Comments: fall 

## 2013-02-21 NOTE — ED Provider Notes (Signed)
Medical screening examination/treatment/procedure(s) were conducted as a shared visit with non-physician practitioner(s) and myself.  I personally evaluated the patient during the encounter.  EKG Interpretation   None       Patient here after a mechanical fall at work in place. She fell out of her wheelchair. Denies any pain. Extremity exam normal. Belly benign. She does have UTI on UA. Will treat with Macrobid. She stable for discharge.  Dagmar HaitWilliam Oluwasemilore Pascuzzi, MD 02/21/13 1005

## 2013-02-22 LAB — URINE CULTURE

## 2013-02-22 NOTE — ED Notes (Signed)
+   Urine Treated per protocol MD.

## 2013-02-23 ENCOUNTER — Telehealth (HOSPITAL_COMMUNITY): Payer: Self-pay | Admitting: Emergency Medicine

## 2013-02-23 NOTE — ED Notes (Signed)
Post ED Visit - Positive Culture Follow-up  Culture report reviewed by antimicrobial stewardship pharmacist: []  Wes Dulaney, Pharm.D., BCPS []  Celedonio MiyamotoJeremy Frens, Pharm.D., BCPS []  Georgina PillionElizabeth Martin, [address]Ph[address] BCPS []  [place] NorthMinh Pham, [address]Pharm[address]D., BCPS, AAHIVP []  Estella HuskMichelle Turner, Pharm.D., BCPS, AAHIVP [x]  Harland GermanAndrew Meyer, Pharm.D., BCPS  Positive urine culture Treated with Macrobid, organism sensitive to the same and no further patient follow-up is required at this time.  Zeb ComfortHolland, Polly Barner 02/23/2013, 3:18 PM

## 2014-02-22 IMAGING — CT CT HEAD W/O CM
2 of 4 series · 16 of 30 positions shown, 19 images · non-contrast
Comparison: 06/01/2012

CLINICAL DATA: Fall, injury, facial trauma

CT HEAD WITHOUT CONTRAST
TECHNIQUE: Contiguous axial images were obtained from the base of
the skull through the vertex without contrast.

[Series 2: head w/o · axial · non-contrast · 0.48mm/px · z∈[-114,-4]mm · 9 of 28 slices shown, 12 images]
[im 3/28  brain]
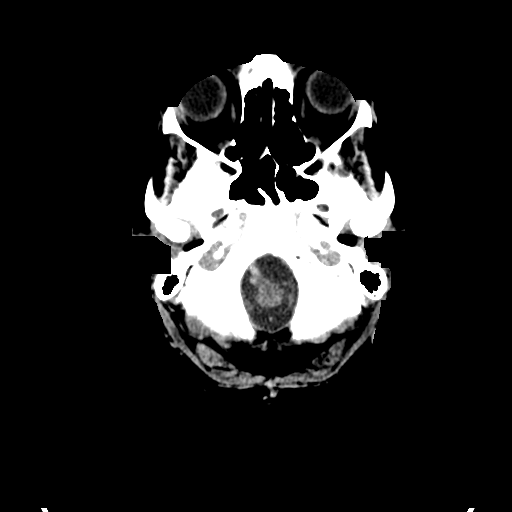
[im 3/28  bone]
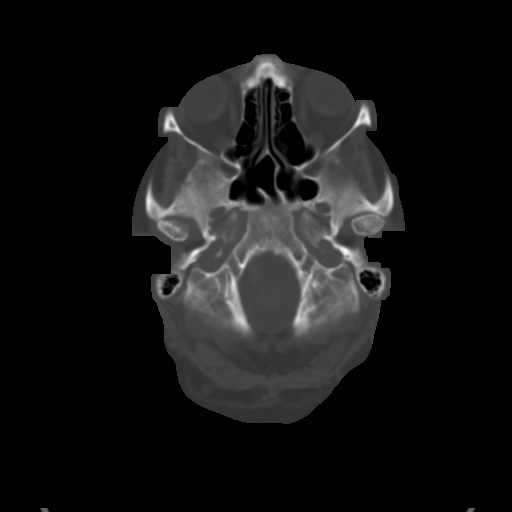
[im 6/28  brain]
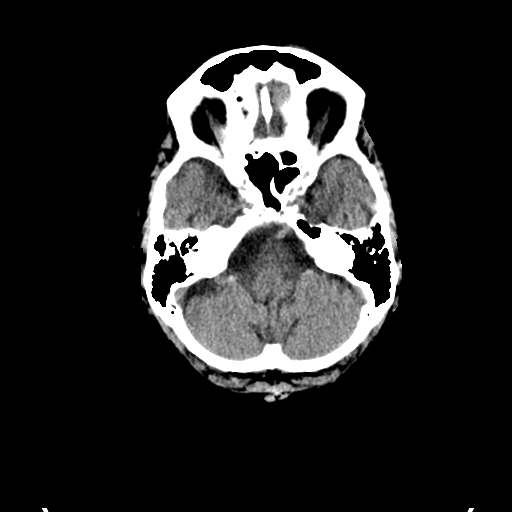
[im 9/28  brain]
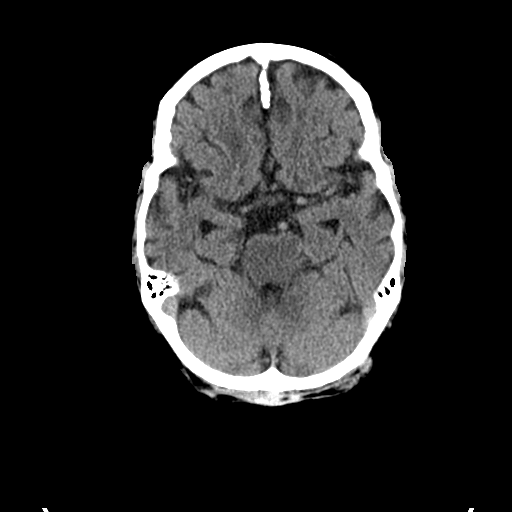
[im 11/28  brain]
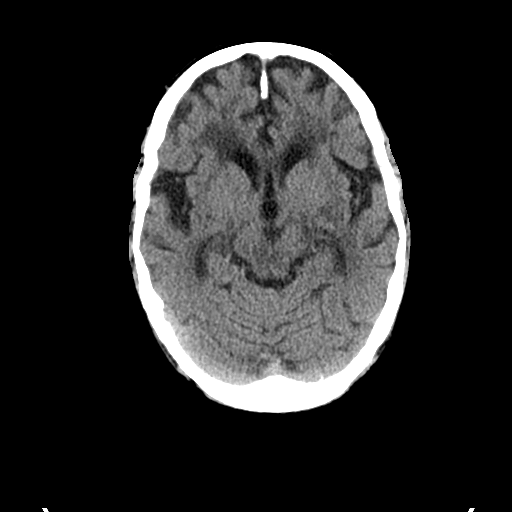
[im 14/28  brain]
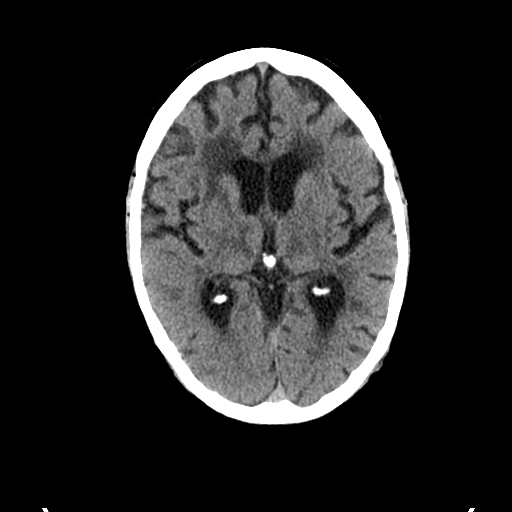
[im 14/28  bone]
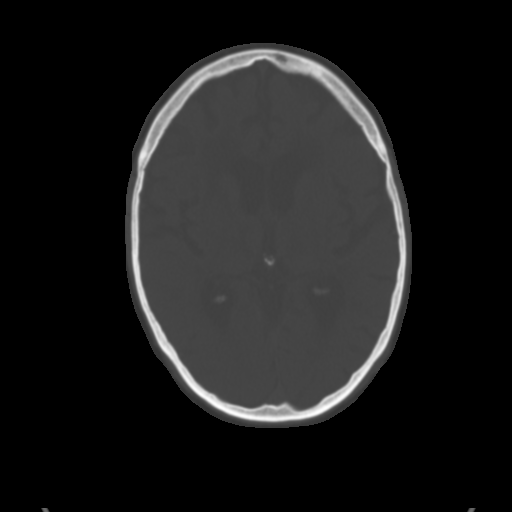
[im 17/28  brain]
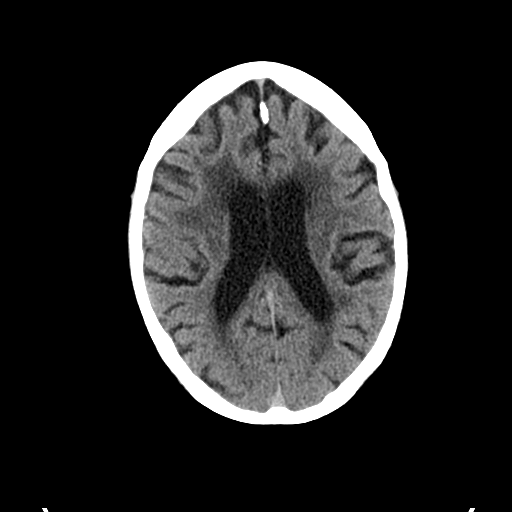
[im 19/28  brain]
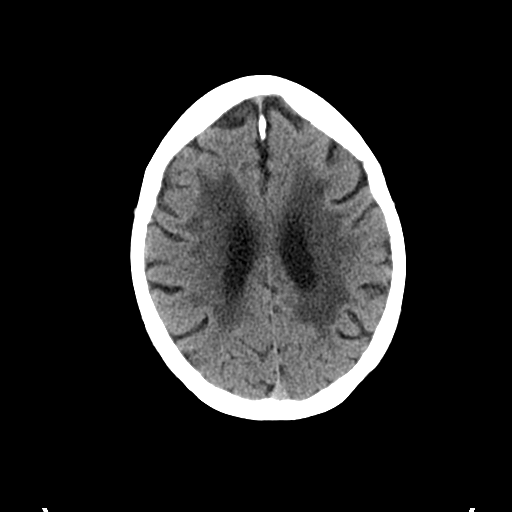
[im 22/28  brain]
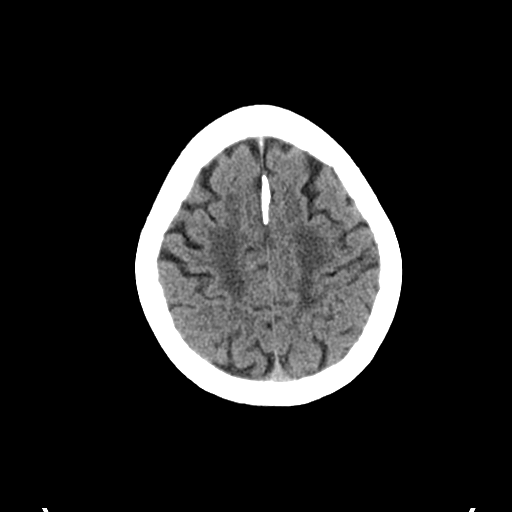
[im 25/28  brain]
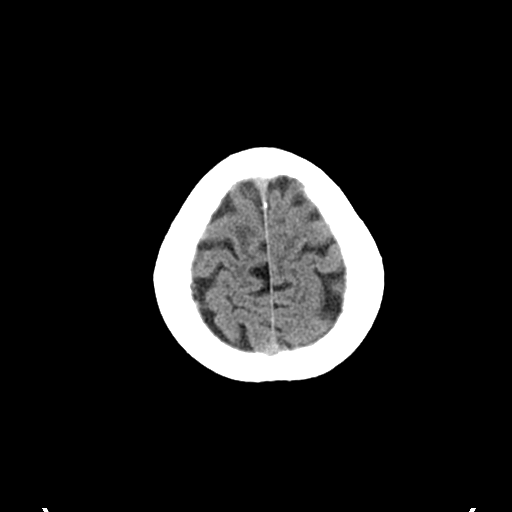
[im 25/28  bone]
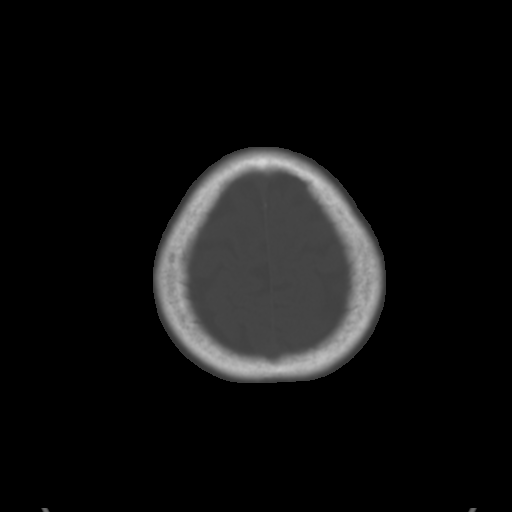

[Series 3: bone windows · axial · 0.48mm/px · z∈[-118,-13]mm · 7 of 47 slices shown]
[im 3/47  bone]
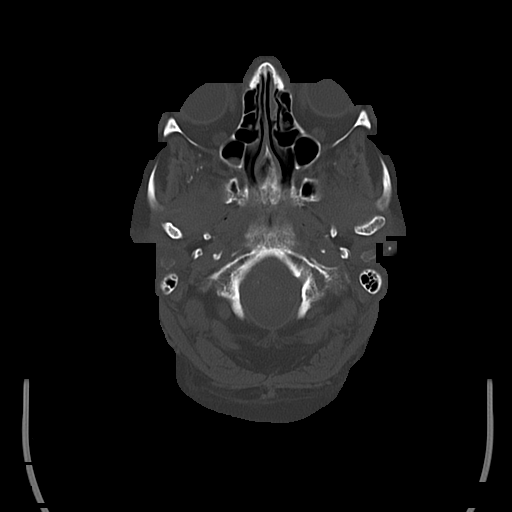
[im 9/47  bone]
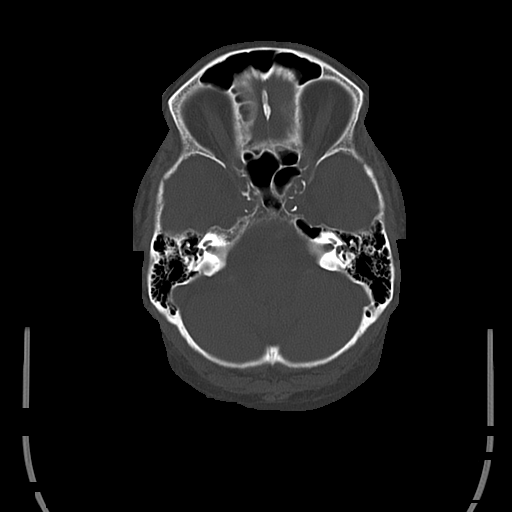
[im 15/47  bone]
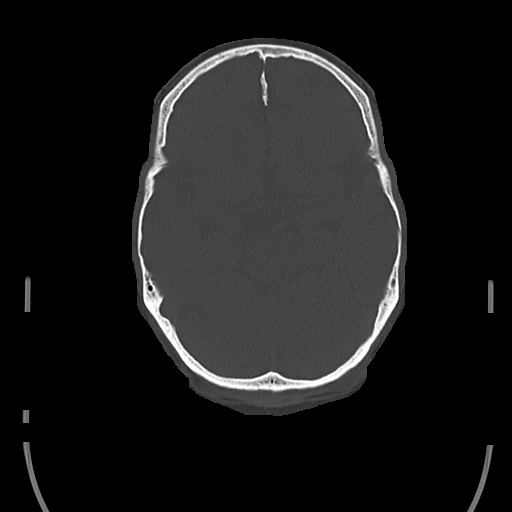
[im 21/47  bone]
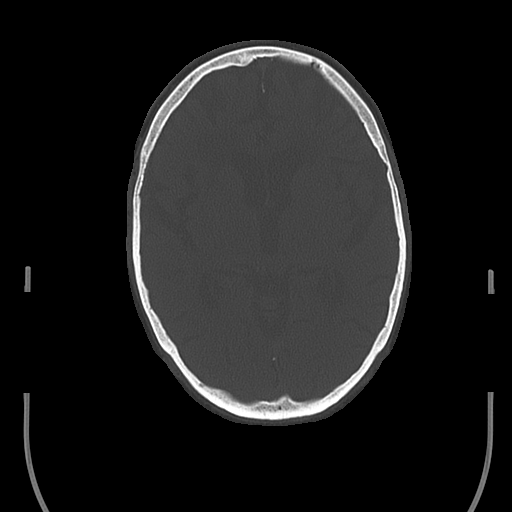
[im 26/47  bone]
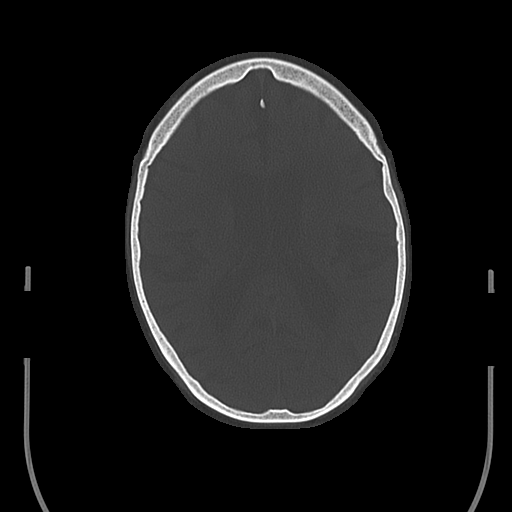
[im 32/47  bone]
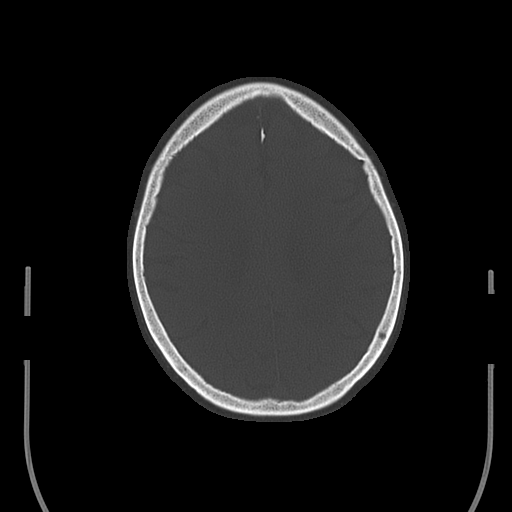
[im 38/47  bone]
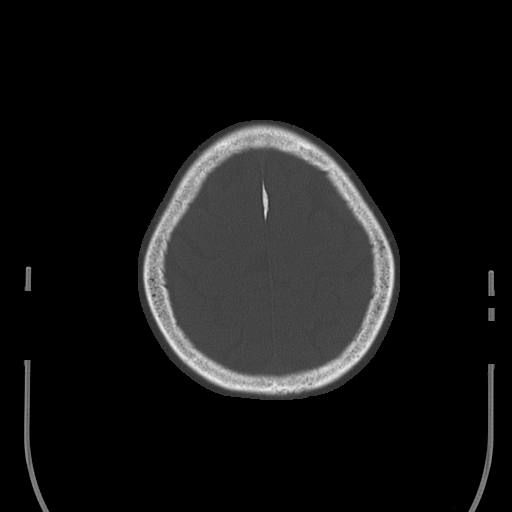

[16 of 30 positions shown; findings below may reference images not displayed]

FINDINGS: Diffuse brain atrophy with extensive chronic
microvascular ischemic changes of white matter.  No acute
intracranial hemorrhage, mass lesion, infarction, midline shift,
herniation, or extra-axial fluid collection.  No focal mass effect
or edema.  Cisterns patent.  Cerebellar atrophy as well.  Symmetric
orbits.  Mastoids are clear.  Minor right maxillary sinus mucosal
thickening with a small air-fluid level.  Other sinuses clear.  No
depressed skull fracture.
IMPRESSION: Stable atrophy and chronic microvascular ischemic changes.

No acute intracranial process

Right maxillary sinus disease.

## 2018-04-11 DEATH — deceased
